# Patient Record
Sex: Female | Born: 1965 | Race: Asian | Hispanic: No | Marital: Married | Smoking: Never smoker
Health system: Southern US, Community
[De-identification: ages and names within clinical notes are randomized; demographics above are authoritative.]

## PROBLEM LIST (undated history)

## (undated) DIAGNOSIS — K219 Gastro-esophageal reflux disease without esophagitis: Secondary | ICD-10-CM

## (undated) DIAGNOSIS — E66811 Obesity, class 1: Secondary | ICD-10-CM

## (undated) DIAGNOSIS — G8929 Other chronic pain: Secondary | ICD-10-CM

## (undated) DIAGNOSIS — L503 Dermatographic urticaria: Secondary | ICD-10-CM

## (undated) DIAGNOSIS — E559 Vitamin D deficiency, unspecified: Secondary | ICD-10-CM

## (undated) DIAGNOSIS — E669 Obesity, unspecified: Secondary | ICD-10-CM

## (undated) DIAGNOSIS — G4730 Sleep apnea, unspecified: Secondary | ICD-10-CM

## (undated) DIAGNOSIS — Z9889 Other specified postprocedural states: Secondary | ICD-10-CM

## (undated) DIAGNOSIS — G8928 Other chronic postprocedural pain: Secondary | ICD-10-CM

## (undated) HISTORY — DX: Obesity, class 1: E66.811

## (undated) HISTORY — DX: Gastro-esophageal reflux disease without esophagitis: K21.9

## (undated) HISTORY — DX: Dermatographic urticaria: L50.3

## (undated) HISTORY — DX: Vitamin D deficiency, unspecified: E55.9

## (undated) HISTORY — DX: Other specified postprocedural states: Z98.890

## (undated) HISTORY — DX: Obesity, unspecified: E66.9

## (undated) HISTORY — DX: Sleep apnea, unspecified: G47.30

## (undated) HISTORY — DX: Other chronic pain: G89.29

## (undated) HISTORY — PX: SPINE SURGERY: SHX786

## (undated) HISTORY — DX: Other chronic postprocedural pain: G89.28

---

## 1997-11-03 ENCOUNTER — Inpatient Hospital Stay (HOSPITAL_COMMUNITY): Admission: AD | Admit: 1997-11-03 | Discharge: 1997-11-03 | Payer: Self-pay | Admitting: Obstetrics & Gynecology

## 1997-11-04 ENCOUNTER — Ambulatory Visit (HOSPITAL_COMMUNITY): Admission: RE | Admit: 1997-11-04 | Discharge: 1997-11-04 | Payer: Self-pay | Admitting: *Deleted

## 1997-11-21 ENCOUNTER — Encounter: Admission: RE | Admit: 1997-11-21 | Discharge: 1997-11-21 | Payer: Self-pay | Admitting: Family Medicine

## 1997-11-22 ENCOUNTER — Encounter: Admission: RE | Admit: 1997-11-22 | Discharge: 1997-11-22 | Payer: Self-pay | Admitting: Family Medicine

## 1997-11-24 ENCOUNTER — Encounter: Admission: RE | Admit: 1997-11-24 | Discharge: 1997-11-24 | Payer: Self-pay | Admitting: Family Medicine

## 1997-11-30 ENCOUNTER — Encounter: Admission: RE | Admit: 1997-11-30 | Discharge: 1997-11-30 | Payer: Self-pay | Admitting: Family Medicine

## 1998-05-24 ENCOUNTER — Encounter: Admission: RE | Admit: 1998-05-24 | Discharge: 1998-05-24 | Payer: Self-pay | Admitting: Family Medicine

## 1998-06-11 ENCOUNTER — Encounter: Admission: RE | Admit: 1998-06-11 | Discharge: 1998-06-11 | Payer: Self-pay | Admitting: Family Medicine

## 1998-07-03 ENCOUNTER — Encounter: Admission: RE | Admit: 1998-07-03 | Discharge: 1998-07-03 | Payer: Self-pay | Admitting: Sports Medicine

## 1998-10-08 ENCOUNTER — Encounter: Admission: RE | Admit: 1998-10-08 | Discharge: 1998-10-08 | Payer: Self-pay | Admitting: Family Medicine

## 1998-10-12 ENCOUNTER — Encounter: Admission: RE | Admit: 1998-10-12 | Discharge: 1998-10-12 | Payer: Self-pay | Admitting: Family Medicine

## 1998-10-16 ENCOUNTER — Ambulatory Visit (HOSPITAL_COMMUNITY): Admission: RE | Admit: 1998-10-16 | Discharge: 1998-10-16 | Payer: Self-pay | Admitting: *Deleted

## 1998-11-07 ENCOUNTER — Encounter: Admission: RE | Admit: 1998-11-07 | Discharge: 1998-11-07 | Payer: Self-pay | Admitting: Family Medicine

## 1998-11-28 ENCOUNTER — Encounter: Admission: RE | Admit: 1998-11-28 | Discharge: 1998-11-28 | Payer: Self-pay | Admitting: Family Medicine

## 1998-12-13 ENCOUNTER — Encounter: Admission: RE | Admit: 1998-12-13 | Discharge: 1998-12-13 | Payer: Self-pay | Admitting: Family Medicine

## 1998-12-27 ENCOUNTER — Encounter: Admission: RE | Admit: 1998-12-27 | Discharge: 1998-12-27 | Payer: Self-pay | Admitting: Family Medicine

## 1999-01-02 ENCOUNTER — Encounter: Admission: RE | Admit: 1999-01-02 | Discharge: 1999-01-02 | Payer: Self-pay | Admitting: Family Medicine

## 1999-01-14 ENCOUNTER — Encounter: Admission: RE | Admit: 1999-01-14 | Discharge: 1999-01-14 | Payer: Self-pay | Admitting: Family Medicine

## 1999-01-20 ENCOUNTER — Inpatient Hospital Stay (HOSPITAL_COMMUNITY): Admission: AD | Admit: 1999-01-20 | Discharge: 1999-01-23 | Payer: Self-pay | Admitting: Obstetrics & Gynecology

## 1999-01-23 ENCOUNTER — Encounter: Admission: RE | Admit: 1999-01-23 | Discharge: 1999-01-23 | Payer: Self-pay | Admitting: Family Medicine

## 1999-03-06 ENCOUNTER — Encounter: Admission: RE | Admit: 1999-03-06 | Discharge: 1999-03-06 | Payer: Self-pay | Admitting: Family Medicine

## 1999-04-22 ENCOUNTER — Encounter: Admission: RE | Admit: 1999-04-22 | Discharge: 1999-04-22 | Payer: Self-pay | Admitting: Family Medicine

## 1999-07-22 ENCOUNTER — Encounter: Admission: RE | Admit: 1999-07-22 | Discharge: 1999-07-22 | Payer: Self-pay | Admitting: Family Medicine

## 1999-09-03 ENCOUNTER — Encounter: Admission: RE | Admit: 1999-09-03 | Discharge: 1999-09-03 | Payer: Self-pay | Admitting: Family Medicine

## 1999-10-14 ENCOUNTER — Encounter: Admission: RE | Admit: 1999-10-14 | Discharge: 1999-10-14 | Payer: Self-pay | Admitting: Family Medicine

## 2000-01-06 ENCOUNTER — Encounter: Admission: RE | Admit: 2000-01-06 | Discharge: 2000-01-06 | Payer: Self-pay | Admitting: Family Medicine

## 2000-04-02 ENCOUNTER — Encounter: Admission: RE | Admit: 2000-04-02 | Discharge: 2000-04-02 | Payer: Self-pay | Admitting: Family Medicine

## 2000-06-26 ENCOUNTER — Encounter: Admission: RE | Admit: 2000-06-26 | Discharge: 2000-06-26 | Payer: Self-pay | Admitting: Family Medicine

## 2000-09-21 ENCOUNTER — Encounter: Admission: RE | Admit: 2000-09-21 | Discharge: 2000-09-21 | Payer: Self-pay | Admitting: Family Medicine

## 2000-12-18 ENCOUNTER — Encounter: Admission: RE | Admit: 2000-12-18 | Discharge: 2000-12-18 | Payer: Self-pay | Admitting: Family Medicine

## 2001-06-11 ENCOUNTER — Encounter: Admission: RE | Admit: 2001-06-11 | Discharge: 2001-06-11 | Payer: Self-pay | Admitting: Family Medicine

## 2001-09-08 ENCOUNTER — Encounter: Admission: RE | Admit: 2001-09-08 | Discharge: 2001-09-08 | Payer: Self-pay | Admitting: Sports Medicine

## 2001-12-03 ENCOUNTER — Encounter: Admission: RE | Admit: 2001-12-03 | Discharge: 2001-12-03 | Payer: Self-pay | Admitting: Family Medicine

## 2002-02-25 ENCOUNTER — Encounter: Admission: RE | Admit: 2002-02-25 | Discharge: 2002-02-25 | Payer: Self-pay | Admitting: Family Medicine

## 2002-05-20 ENCOUNTER — Encounter: Admission: RE | Admit: 2002-05-20 | Discharge: 2002-05-20 | Payer: Self-pay | Admitting: Family Medicine

## 2002-08-12 ENCOUNTER — Encounter: Admission: RE | Admit: 2002-08-12 | Discharge: 2002-08-12 | Payer: Self-pay | Admitting: Family Medicine

## 2002-10-28 ENCOUNTER — Encounter: Admission: RE | Admit: 2002-10-28 | Discharge: 2002-10-28 | Payer: Self-pay | Admitting: Family Medicine

## 2003-01-20 ENCOUNTER — Encounter: Admission: RE | Admit: 2003-01-20 | Discharge: 2003-01-20 | Payer: Self-pay | Admitting: Sports Medicine

## 2003-02-10 ENCOUNTER — Encounter: Admission: RE | Admit: 2003-02-10 | Discharge: 2003-02-10 | Payer: Self-pay | Admitting: Sports Medicine

## 2003-04-21 ENCOUNTER — Encounter: Admission: RE | Admit: 2003-04-21 | Discharge: 2003-04-21 | Payer: Self-pay | Admitting: Family Medicine

## 2003-07-14 ENCOUNTER — Encounter: Admission: RE | Admit: 2003-07-14 | Discharge: 2003-07-14 | Payer: Self-pay | Admitting: Family Medicine

## 2003-10-13 ENCOUNTER — Encounter: Admission: RE | Admit: 2003-10-13 | Discharge: 2003-10-13 | Payer: Self-pay | Admitting: Family Medicine

## 2004-01-12 ENCOUNTER — Ambulatory Visit: Payer: Self-pay | Admitting: Family Medicine

## 2004-04-12 ENCOUNTER — Ambulatory Visit: Payer: Self-pay | Admitting: Family Medicine

## 2004-07-12 ENCOUNTER — Ambulatory Visit: Payer: Self-pay | Admitting: Family Medicine

## 2004-09-17 ENCOUNTER — Ambulatory Visit: Payer: Self-pay | Admitting: Family Medicine

## 2004-10-10 ENCOUNTER — Ambulatory Visit: Payer: Self-pay | Admitting: Family Medicine

## 2004-11-11 ENCOUNTER — Ambulatory Visit: Payer: Self-pay | Admitting: Family Medicine

## 2004-11-18 ENCOUNTER — Encounter: Admission: RE | Admit: 2004-11-18 | Discharge: 2005-02-16 | Payer: Self-pay | Admitting: Family Medicine

## 2005-01-03 ENCOUNTER — Ambulatory Visit: Payer: Self-pay | Admitting: Family Medicine

## 2005-03-28 ENCOUNTER — Ambulatory Visit: Payer: Self-pay | Admitting: Sports Medicine

## 2005-06-27 ENCOUNTER — Ambulatory Visit: Payer: Self-pay | Admitting: Family Medicine

## 2005-09-26 ENCOUNTER — Ambulatory Visit: Payer: Self-pay | Admitting: Family Medicine

## 2005-11-30 ENCOUNTER — Encounter: Admission: RE | Admit: 2005-11-30 | Discharge: 2005-11-30 | Payer: Self-pay | Admitting: Internal Medicine

## 2005-12-19 ENCOUNTER — Ambulatory Visit: Payer: Self-pay | Admitting: Family Medicine

## 2006-01-05 ENCOUNTER — Ambulatory Visit: Payer: Self-pay | Admitting: Family Medicine

## 2006-01-07 ENCOUNTER — Encounter (INDEPENDENT_AMBULATORY_CARE_PROVIDER_SITE_OTHER): Payer: Self-pay | Admitting: *Deleted

## 2006-01-07 LAB — CONVERTED CEMR LAB

## 2006-03-20 ENCOUNTER — Ambulatory Visit: Payer: Self-pay | Admitting: Family Medicine

## 2006-04-30 DIAGNOSIS — M2540 Effusion, unspecified joint: Secondary | ICD-10-CM | POA: Insufficient documentation

## 2006-05-01 ENCOUNTER — Encounter (INDEPENDENT_AMBULATORY_CARE_PROVIDER_SITE_OTHER): Payer: Self-pay | Admitting: *Deleted

## 2006-06-18 ENCOUNTER — Ambulatory Visit: Payer: Self-pay | Admitting: Family Medicine

## 2006-09-15 ENCOUNTER — Ambulatory Visit: Payer: Self-pay | Admitting: Family Medicine

## 2006-12-04 ENCOUNTER — Encounter: Payer: Self-pay | Admitting: Family Medicine

## 2006-12-04 ENCOUNTER — Ambulatory Visit: Payer: Self-pay | Admitting: Family Medicine

## 2007-01-19 ENCOUNTER — Encounter: Admission: RE | Admit: 2007-01-19 | Discharge: 2007-01-19 | Payer: Self-pay | Admitting: Internal Medicine

## 2007-04-07 ENCOUNTER — Ambulatory Visit (HOSPITAL_COMMUNITY): Admission: RE | Admit: 2007-04-07 | Discharge: 2007-04-07 | Payer: Self-pay | Admitting: Neurosurgery

## 2007-04-29 ENCOUNTER — Ambulatory Visit: Payer: Self-pay | Admitting: Family Medicine

## 2007-05-18 ENCOUNTER — Encounter: Admission: RE | Admit: 2007-05-18 | Discharge: 2007-05-18 | Payer: Self-pay | Admitting: Neurosurgery

## 2007-06-29 ENCOUNTER — Encounter: Admission: RE | Admit: 2007-06-29 | Discharge: 2007-06-29 | Payer: Self-pay | Admitting: Neurosurgery

## 2007-07-19 ENCOUNTER — Telehealth: Payer: Self-pay | Admitting: Family Medicine

## 2007-08-11 ENCOUNTER — Ambulatory Visit: Payer: Self-pay | Admitting: Family Medicine

## 2007-09-14 ENCOUNTER — Encounter: Payer: Self-pay | Admitting: Family Medicine

## 2008-01-05 ENCOUNTER — Encounter: Payer: Self-pay | Admitting: Family Medicine

## 2008-02-16 ENCOUNTER — Encounter: Payer: Self-pay | Admitting: *Deleted

## 2009-10-08 IMAGING — CR DG CERVICAL SPINE 1V
1 series · 1 of 1 positions shown · non-contrast
Comparison: 05/18/2007

CLINICAL DATA: Cervical spondylosis.  Recent cervical spine fusion.

CERVICAL SPINE - 1 VIEW

[view not recorded]
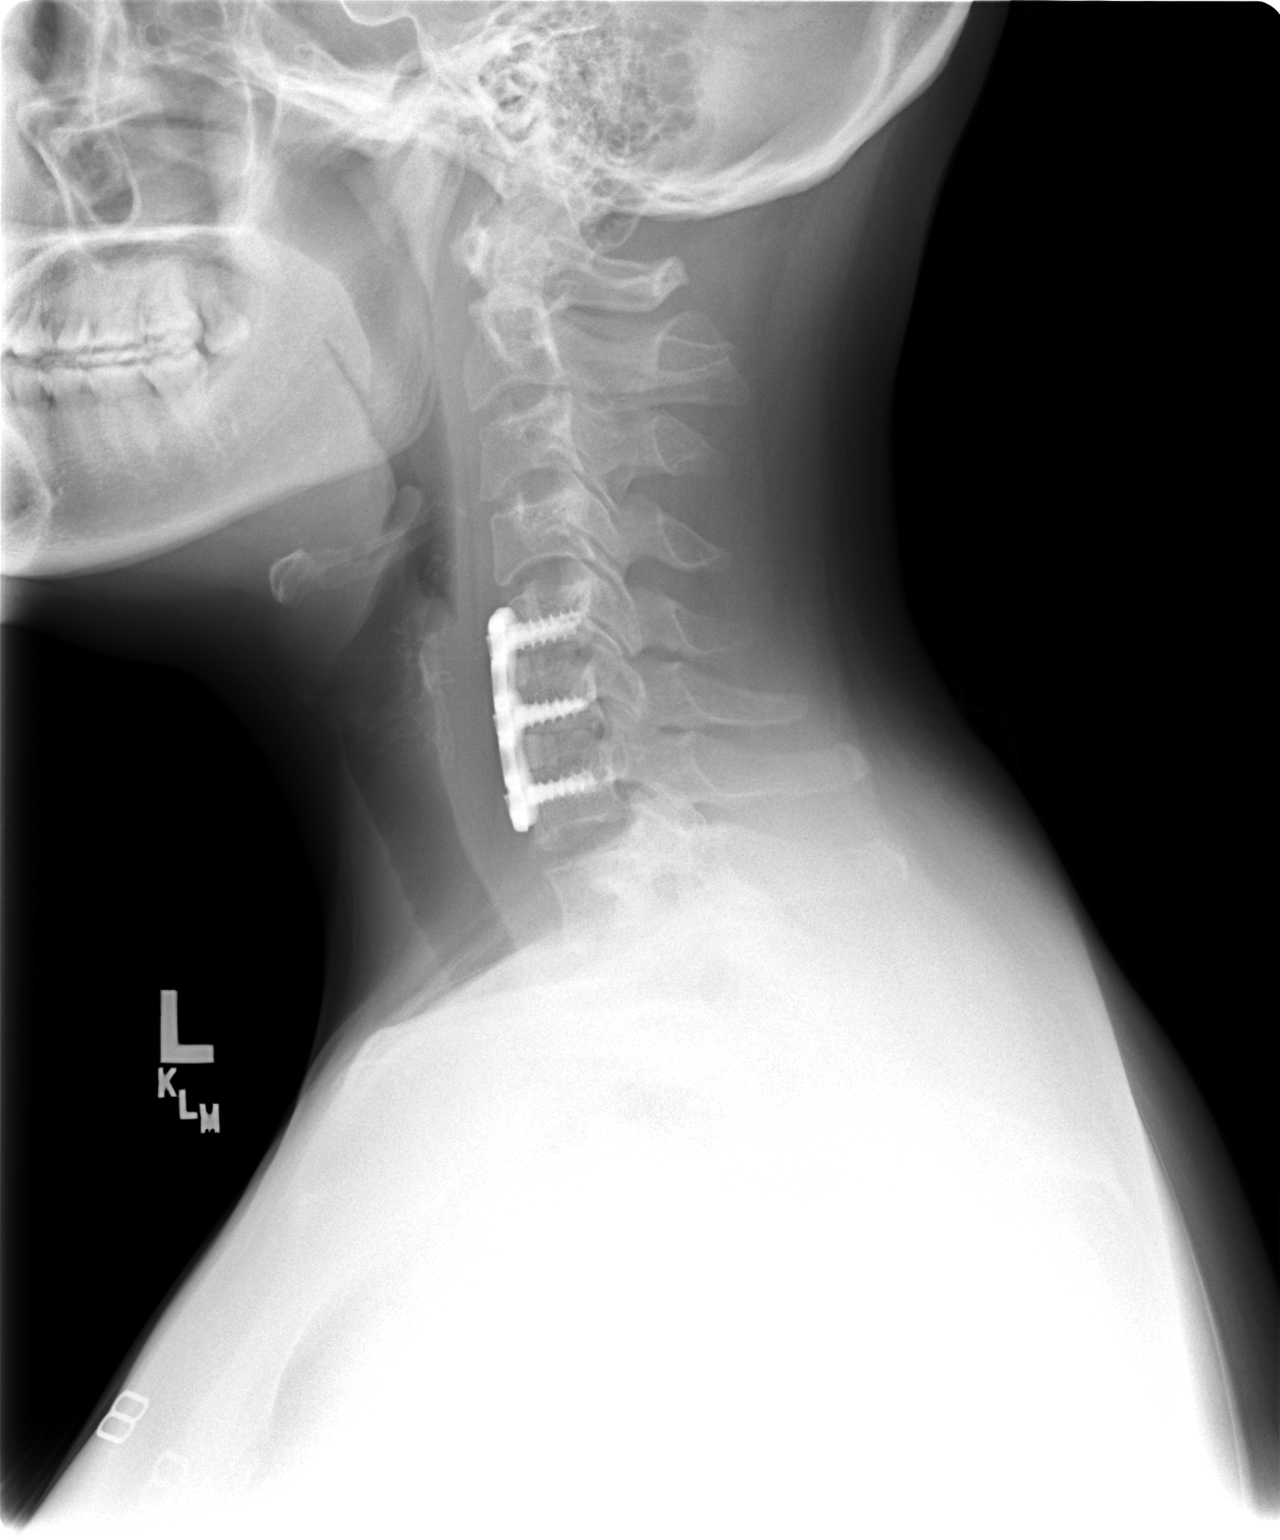

[1 of 1 positions shown; findings below may reference images not displayed]

FINDINGS: Anterior fixation plate and screws and interbody bone
plugs remain in appropriate position at C5-6 and C6-7.  Alignment
is normal.

The other intervertebral disc spaces are maintained.  No other
significant bone abnormality identified.  There is no evidence of
prevertebral soft tissue swelling.
IMPRESSION: Expected postoperative appearance of the anterior cervical spine
fusion from C5 to C7.  Normal alignment.

## 2010-07-16 NOTE — Op Note (Signed)
NAME:  Erika Perez, Erika Perez'                   ACCOUNT NO.:  [id number]   MEDICAL RECORD NO.:  [id number]          PATIENT TYPE:  OIB   LOCATION:  3599                         FACILITY:  [facility]   PHYSICIAN:  Donalee Citrin, M.D.        DATE OF BIRTH:  16-Jun-1965   DATE OF PROCEDURE:  04/07/2007  DATE OF DISCHARGE:                               OPERATIVE REPORT   PREOPERATIVE DIAGNOSIS:  Cervical spondylosis with C6 and C7  radiculopathy, at C5-6 and C6-7.   PROCEDURE:  Anterior cervical diskectomy and fusion, C5-6 and C6-7,  using a 6-mm allograft at C5-6 and a 7-mm allograft at C6-7, and a 37.5  mm Venture plate with six 04-VW variable-angled screws.   SURGEON:  Donalee Citrin, MD.   ASSISTANT:  Tia Alert, MD.   ANESTHESIA:  General endotracheal anesthesia.   HISTORY OF PRESENT ILLNESS:  Ms. Duffy is a very pleasant 45 year old  white female who has a longstanding neck and bilateral arm pain, worse  on the left, radiating down to her forearm and thumb and forefinger.  It  has been refractory to all forms of conservative treatment over the last  several years.  The patient noticed weakness in her hand and weakness in  her triceps preoperatively.  MRI scan showed spondylosis with  spondylitic compression of the spinal cord and both C6 and C7 nerve  roots.  The patient now after failure of conservative treatment was  recommended anterior cervical diskectomy and fusion.  The risks and  benefits of the operation were discussed with the patient and she  understands and agreed to proceed forward.   The patient was brought into the OR, was induced under general  anesthesia, positioned supine, and after the neck was placed in 5 pounds  of halter traction, the right side of the neck was prepped and draped in  the usual sterile fashion.  Preoperative imaging localized the  appropriate level.  A curvilinear incision was made just off the midline  to the anterior border of the sternocleidomastoid muscle.   The platysma  was dissected down and divided longitudinally.  The avascular plane  between the sternocleidomastoid and strap muscles was developed down to  the prevertebral fascia.  The prevertebral fascia was then dissected  away with Kitners.  Intraoperative x-ray confirmed localization of the  appropriate level.  Annulotomy was made with an 11 blade and a 15-blade  scalpel and then the disk space was cleaned out.  The longus colli was  then reflected laterally and the self-retaining retractor was placed.  Both interspaces were then cleaned out.  A large anterior osteophyte was  bitten off with the 2 and 3-mm Kerrison punch.  High-speed drill was  used to drill down both interspaces to the posterior annulus and  posterior osteophytic complexes.  Then at this point the operating  microscope was draped and brought into the field.  Under microscopic  illumination the C6-7 disk space with a 1-mm Kerrison punch used to  remove the posterior annulus and posterior longitudinal ligament.  Large  posterior osteophytes were  coming off the C6 vertebral body.  These were  all aggressively underbitten, decompressing the central canal, marching  across the endplates to decompress the right C7 neural foramen.  After  this was attained, attention was taken to mark off the left side.  The  left C7 nerve root was skeletonized flush with the pedicle and  completely decompressed, explored with an angled nerve hook and noted to  be widely patent.  Gelfoam was placed in the disk space.  Attention was  taken to C5-6.  At C5-6 in a similar fashion the disk space was drilled  down and cleaned out.  Then with the 2-mm Kerrison punch large anterior  osteophytes were bitten off the C5 and C6 vertebral bodies,  decompressing the central canal and both C6 nerve roots.  The C6 nerve  root on the left was especially skeletonized flush with the pedicle, as  well as the right, and then the endplates were scraped.  The  wound was  copiously irrigated and meticulous hemostasis was maintained.  A 6-mm  allograft was inserted at C5-6 and a 7-mm at C6-7 after preparation of  the endplates.  Then a 37.5-mm Venture plate was placed.  All screws had  excellent purchase.  Locking mechanism engaged.  The wound was then  copiously irrigated and meticulous hemostasis was maintained.  The wound  was closed in layers with interrupted  Vicryl in the platysma and a  running 4-0 subcuticular.  Benzoin and Steri-Strips were applied.  The  patient went to the recovery room in stable condition.  At the end of  the case the instrument, needle and sponge count was correct.           ______________________________  Donalee Citrin, M.D.     GC/MEDQ  D:  04/07/2007  T:  04/08/2007  Job:  [id number]

## 2010-11-22 LAB — CBC
HCT: 38.2
Hemoglobin: 12.9
MCV: 87.3
Platelets: 235
RBC: 4.38
WBC: 7.5

## 2010-11-22 LAB — BASIC METABOLIC PANEL
BUN: 4 — ABNORMAL LOW
Chloride: 108
Potassium: 4.7

## 2011-10-30 ENCOUNTER — Ambulatory Visit (INDEPENDENT_AMBULATORY_CARE_PROVIDER_SITE_OTHER): Payer: 59 | Admitting: Physician Assistant

## 2011-10-30 VITALS — BP 122/70 | HR 72 | Temp 98.0°F | Resp 14 | Ht 60.5 in | Wt 138.6 lb

## 2011-10-30 DIAGNOSIS — R509 Fever, unspecified: Secondary | ICD-10-CM

## 2011-10-30 DIAGNOSIS — J029 Acute pharyngitis, unspecified: Secondary | ICD-10-CM

## 2011-10-30 LAB — POCT CBC
Granulocyte percent: 66.8 %G (ref 37–80)
MID (cbc): 0.5 (ref 0–0.9)
MPV: 10.9 fL (ref 0–99.8)
POC Granulocyte: 5.2 (ref 2–6.9)
POC LYMPH PERCENT: 26.7 %L (ref 10–50)
POC MID %: 6.5 %M (ref 0–12)
Platelet Count, POC: 273 10*3/uL (ref 142–424)
RDW, POC: 13.1 %

## 2011-10-30 MED ORDER — MAGIC MOUTHWASH W/LIDOCAINE: 5.0000 mL | Freq: Four times a day (QID) | ORAL | Status: DC | PRN

## 2011-10-30 MED ORDER — FLUTICASONE PROPIONATE 50 MCG/ACT NA SUSP: 2.0000 | Freq: Every day | NASAL | Status: DC

## 2011-10-30 NOTE — Progress Notes (Signed)
  Subjective:    Patient ID: Erika Perez, female    DOB: 11-09-1965, 46 y.o.   MRN: [id number]  HPI 46 yr old [place] female presents with a sore throat X 1 day.  She hasn't felt great for the past week. She has also had nasal congestion.  She denies cough.  Unsure of fever-she felt hot yesterday.  No night sweats. No N/V/D.   Review of Systems  All other systems reviewed and are negative.      Objective:   Physical Exam  Nursing note and vitals reviewed. Constitutional: She is oriented to person, place, and time. She appears well-developed and well-nourished.  HENT:  Head: Normocephalic and atraumatic.  Right Ear: External ear normal.  Left Ear: External ear normal.  Mouth/Throat: No oropharyngeal exudate (erythema of throat ).  Neck: Normal range of motion. Neck supple. No thyromegaly present.  Cardiovascular: Normal rate, regular rhythm and normal heart sounds.   Pulmonary/Chest: Effort normal and breath sounds normal.  Lymphadenopathy:    She has no cervical adenopathy.  Neurological: She is alert and oriented to person, place, and time.  Skin: Skin is warm and dry.  Psychiatric: She has a normal mood and affect. Her behavior is normal.    Results for orders placed in visit on 10/30/11  POCT RAPID STREP A (OFFICE)      Component Value Range   Rapid Strep A Screen Negative  Negative  POCT CBC      Component Value Range   WBC 7.8  4.6 - 10.2 K/uL   Lymph, poc 2.1  0.6 - 3.4   POC LYMPH PERCENT 26.7  10 - 50 %L   MID (cbc) 0.5  0 - 0.9   POC MID % 6.5  0 - 12 %M   POC Granulocyte 5.2  2 - 6.9   Granulocyte percent 66.8  37 - 80 %G   RBC 4.73  4.04 - 5.48 M/uL   Hemoglobin 13.2  12.2 - 16.2 g/dL   HCT, POC 40.9  81.1 - 47.9 %   MCV 92.4  80 - 97 fL   MCH, POC 27.9  27 - 31.2 pg   MCHC 30.2 (*) 31.8 - 35.4 g/dL   RDW, POC 91.4     Platelet Count, POC 273  142 - 424 K/uL   MPV 10.9  0 - 99.8 fL       Assessment & Plan:  Non-strep pharyngitis/URI-MMW  and flonase. Fluids, rest.  Recheck in 48 hrs if not improving-sooner if worse.

## 2013-06-13 ENCOUNTER — Ambulatory Visit (INDEPENDENT_AMBULATORY_CARE_PROVIDER_SITE_OTHER): Payer: 59 | Admitting: Family Medicine

## 2013-06-13 VITALS — BP 108/66 | HR 72 | Temp 98.1°F | Resp 16 | Ht 60.5 in | Wt 141.0 lb

## 2013-06-13 DIAGNOSIS — Z0000 Encounter for general adult medical examination without abnormal findings: Secondary | ICD-10-CM

## 2013-06-13 LAB — GLUCOSE, POCT (MANUAL RESULT ENTRY): POC Glucose: 105 mg/dl — AB (ref 70–99)

## 2013-06-13 NOTE — Patient Instructions (Signed)

## 2013-06-13 NOTE — Progress Notes (Signed)
° °  Subjective:    Patient ID: Erika Perez, female    DOB: 1965-06-30, 48 y.o.   MRN: [id number] This chart was scribed for Elvina SidleKurt Lauenstein, MD by Marica OtterNusrat Rahman, ED Scribe at [facility]. This patient was seen in room Room 11 and the patient's care was started at 10:55 AM.  HPI  HPI Comments: Erika Perez is a 48 y.o. female, with a history of effusion/swelling of joint (unspecified), who presents to the [facility] for her annual exam. Pt denies any problems with her abd and reports she has not had any appetite changes.   Pt is from [place] and currently works in a Development worker, [employer]. Pt has 2 children a 46 yo and 46 yo.    Review of Systems  Constitutional: Negative for appetite change.  Gastrointestinal: Negative for abdominal pain.  All other systems reviewed and are negative.  Objective:  Physical Exam  Nursing note and vitals reviewed.  patient in no acute distress, she seen with her husband and she's cooperative and friendly. HEENT: Unremarkable Chest: Clear Heart: Regular no murmur Abdomen: Soft nontender without HSM Extremities: No edema   Assessment & Plan:  DIAGNOSTIC STUDIES: Oxygen Saturation is 99% on RA, normal by my interpretation.    COORDINATION OF CARE: 11:01 AM-Discussed treatment plan which includes labs with pt at bedside and pt agreed to plan. Further spoke with pt regarding contacting them with lab results. Pt is also advised to get a mammogram.   Annual physical exam - Plan: POCT glucose (manual entry), Lipid panel  Signed, Elvina SidleKurt Lauenstein, MD

## 2013-06-14 LAB — LIPID PANEL
Cholesterol: 141 mg/dL (ref 0–200)
HDL: 51 mg/dL (ref >39)
LDL Cholesterol: 80 mg/dL (ref 0–99)
Total CHOL/HDL Ratio: 2.8 Ratio
Triglycerides: 52 mg/dL (ref <150)
VLDL: 10 mg/dL (ref 0–40)

## 2014-05-23 ENCOUNTER — Ambulatory Visit (INDEPENDENT_AMBULATORY_CARE_PROVIDER_SITE_OTHER): Payer: 59 | Admitting: Family Medicine

## 2014-05-23 VITALS — BP 108/64 | HR 77 | Temp 98.3°F | Resp 16 | Ht 60.5 in | Wt 146.0 lb

## 2014-05-23 DIAGNOSIS — J0101 Acute recurrent maxillary sinusitis: Secondary | ICD-10-CM

## 2014-05-23 MED ORDER — AMOXICILLIN 875 MG PO TABS: 875.0000 mg | ORAL_TABLET | Freq: Two times a day (BID) | ORAL | Status: DC

## 2014-05-23 MED ORDER — HYDROCODONE-HOMATROPINE 5-1.5 MG/5ML PO SYRP
5.0000 mL | ORAL_SOLUTION | Freq: Three times a day (TID) | ORAL | Status: DC | PRN
Start: 2014-05-23 — End: 2019-03-01

## 2014-05-23 NOTE — Patient Instructions (Signed)

## 2014-05-23 NOTE — Progress Notes (Signed)
° °  Subjective:  This chart was scribed for Erika SidleKurt Lauenstein, MD by Richarda Overlieichard Holland, Medical scribe. This patient was seen in ROOM 9 and the patient's care was started 5:28 PM.   Patient ID: Erika Perez, female    DOB: [birth year], 49 y.o.   MRN: [id number] Chief Complaint  Patient presents with   Illness    congestion, feverm aches, cough x 1 week    HPI  HPI Comments: H PRIEM B Marlan PalauBuonya is a 49 y.o. female who [facility] complaining of congestion for the last week. Pt reports associated fever, body aches, sore throat, HA, sinus pressure, rhinorrhea and cough. She states that she has felt slightly nauseated today but says she did not vomit. Pt reports she has had trouble sleeping due to her symptoms, especially her cough. Her husband reports NKDA. Pt reports no pertinent past medical history at this time. She denies any trouble breathing.    Patient Active Problem List   Diagnosis Date Noted   EFFUSION/SWELLING OF JOINT, UNSPECIFIED 04/30/2006   No past medical history on file. No Known Allergies No current outpatient prescriptions on file prior to visit.   No current facility-administered medications on file prior to visit.   No family history on file. History   Social History   Marital Status: Married    Spouse Name: N/A   Number of Children: N/A   Years of Education: N/A   Occupational History   Not on file.   Social History Main Topics   Smoking status: Never Smoker    Smokeless tobacco: Not on file   Alcohol Use: No   Drug Use: No   Sexual Activity: Not on file   Other Topics Concern   Not on file   Social History Narrative    Review of Systems  HENT: Positive for congestion, rhinorrhea, sinus pressure and sore throat.   Respiratory: Positive for cough.   Gastrointestinal: Positive for nausea. Negative for abdominal pain.  Neurological: Positive for headaches.       Objective:   Physical Exam  Constitutional: She is oriented to  person, place, and time. She appears well-developed and well-nourished.  HENT:  Head: Normocephalic and atraumatic.  Mcosa purulent discharge both nasal passages with marked erytehrma. Tenderness on maxillary bones.  Eyes: Right eye exhibits no discharge. Left eye exhibits no discharge.  Neck: Normal range of motion. Neck supple. No tracheal deviation present.  Cardiovascular: Normal rate.   Pulmonary/Chest: Effort normal. No respiratory distress. She exhibits no tenderness.  Abdominal: She exhibits no distension.  Neurological: She is alert and oriented to person, place, and time.  Skin: Skin is warm and dry.  Psychiatric: She has a normal mood and affect.  Nursing note and vitals reviewed.  Filed Vitals:   05/23/14 1717  BP: 108/64  Pulse: 77  Temp: 98.3 F (36.8 C)  Resp: 16  Height: 5' 0.5" (1.537 m)  Weight: 146 lb (66.225 kg)  SpO2: 100%       Assessment & Plan:   This chart was scribed in my presence and reviewed by me personally.    ICD-9-CM ICD-10-CM   1. Recurrent maxillary sinusitis, unspecified chronicity 461.0 J01.01 amoxicillin (AMOXIL) 875 MG tablet     HYDROcodone-homatropine (HYCODAN) 5-1.5 MG/5ML syrup     Signed, Erika SidleKurt Lauenstein, MD

## 2014-11-13 ENCOUNTER — Ambulatory Visit (INDEPENDENT_AMBULATORY_CARE_PROVIDER_SITE_OTHER): Payer: 59 | Admitting: Family Medicine

## 2014-11-13 VITALS — BP 110/62 | HR 71 | Temp 98.0°F | Resp 16 | Ht 60.5 in | Wt 141.8 lb

## 2014-11-13 DIAGNOSIS — M722 Plantar fascial fibromatosis: Secondary | ICD-10-CM | POA: Diagnosis not present

## 2014-11-13 NOTE — Patient Instructions (Signed)
It seems that you have plantar fascitis This is a common condition that causes pain in the bottom of your feet Stretch your feet before you get up in the morning Wear supportive shoes and "heel cups" which you can buy at the store You can also try ibuprofen or aleve as needed  Let me know if you are not improved in 1-2 weeks

## 2014-11-13 NOTE — Progress Notes (Signed)
[facility] [address] [place] [address] [phone]  Date:  11/13/2014   Name:  Erika Perez   DOB:  11-01-1965   MRN:  [id number]  PCP:  No primary care provider on file.    Chief Complaint: Foot Pain   History of Present Illness:  [name] is a 49 y.o. very pleasant female patient who presents with the following:  Here today to evaluate foot pain- both feet hurt but the left is worse.   She has noted this for about 2 weeks.   No known injury, no change in activity The pain is worst in the early am She is OW generally in good health   She is post- menopausal  Patient Active Problem List   Diagnosis Date Noted  . EFFUSION/SWELLING OF JOINT, UNSPECIFIED 04/30/2006    History reviewed. No pertinent past medical history.  Past Surgical History  Procedure Laterality Date  . Cesarean section    . Spine surgery      Social History  Substance Use Topics  . Smoking status: Never Smoker   . Smokeless tobacco: None  . Alcohol Use: No    History reviewed. No pertinent family history.  No Known Allergies  Medication list has been reviewed and updated.  Current Outpatient Prescriptions on File Prior to Visit  Medication Sig Dispense Refill  . amoxicillin (AMOXIL) 875 MG tablet Take 1 tablet (875 mg total) by mouth 2 (two) times daily. (Patient not taking: Reported on 11/13/2014) 20 tablet 0  . HYDROcodone-homatropine (HYCODAN) 5-1.5 MG/5ML syrup Take 5 mLs by mouth every 8 (eight) hours as needed for cough. (Patient not taking: Reported on 11/13/2014) 120 mL 0   No current facility-administered medications on file prior to visit.    Review of Systems:  As per HPI- otherwise negative.   Physical Examination: Filed Vitals:   11/13/14 1003  BP: 110/62  Pulse: 71  Temp: 98 F (36.7 C)  Resp: 16   Filed Vitals:   11/13/14 1003  Height: 5' 0.5" (1.537 m)  Weight: 141 lb 12.8 oz (64.32 kg)   Body mass index is 27.23  kg/(m^2). Ideal Body Weight: Weight in (lb) to have BMI = 25: 129.9  GEN: WDWN, NAD, Non-toxic, A & O x 3 HEENT: Atraumatic, Normocephalic. Neck supple. No masses, No LAD. Ears and Nose: No external deformity. CV: RRR, No M/G/R. No JVD. No thrill. No extra heart sounds. PULM: CTA B, no wheezes, crackles, rhonchi. No retractions. No resp. distress. No accessory muscle use. EXTR: No c/c/e NEURO Normal gait.  PSYCH: Normally interactive. Conversant. Not depressed or anxious appearing.  Calm demeanor.  Left foot: she is mildly tender under the left heel-consistent with plantar fascitis.  Otherwise feet, ankles are negative, normal BDP pulses, normal capillary refill Wearing flip-flops  Assessment and Plan: Plantar fasciitis of left foot  Discussed stretches, NSAIDs, can roll, wearing more supporitve shoes and using heel cups She will work on these things and let me know if not improving  Signed Abbe Amsterdam, MD

## 2016-11-17 ENCOUNTER — Encounter: Payer: Self-pay | Admitting: Physician Assistant

## 2016-11-17 ENCOUNTER — Ambulatory Visit (INDEPENDENT_AMBULATORY_CARE_PROVIDER_SITE_OTHER): Payer: 59 | Admitting: Physician Assistant

## 2016-11-17 VITALS — BP 122/78 | HR 80 | Temp 98.2°F | Ht 65.0 in | Wt 155.0 lb

## 2016-11-17 DIAGNOSIS — R109 Unspecified abdominal pain: Secondary | ICD-10-CM

## 2016-11-17 DIAGNOSIS — R300 Dysuria: Secondary | ICD-10-CM

## 2016-11-17 DIAGNOSIS — R5381 Other malaise: Secondary | ICD-10-CM

## 2016-11-17 LAB — CBC
HEMATOCRIT: 35.5 % (ref 34.0–46.6)
HEMOGLOBIN: 11.5 g/dL (ref 11.1–15.9)
MCH: 28 pg (ref 26.6–33.0)
MCHC: 32.4 g/dL (ref 31.5–35.7)
MCV: 87 fL (ref 79–97)
Platelets: 290 10*3/uL (ref 150–379)
RBC: 4.1 x10E6/uL (ref 3.77–5.28)
RDW: 12.8 % (ref 12.3–15.4)
WBC: 7.3 10*3/uL (ref 3.4–10.8)

## 2016-11-17 LAB — POCT URINALYSIS DIP (MANUAL ENTRY)
GLUCOSE UA: NEGATIVE mg/dL
Ketones, POC UA: NEGATIVE mg/dL
Nitrite, UA: NEGATIVE
Spec Grav, UA: 1.01 (ref 1.010–1.025)
UROBILINOGEN UA: 0.2 U/dL
pH, UA: 6 (ref 5.0–8.0)

## 2016-11-17 MED ORDER — SULFAMETHOXAZOLE-TRIMETHOPRIM 800-160 MG PO TABS: 1.0000 | ORAL_TABLET | Freq: Two times a day (BID) | ORAL | 0 refills | Status: AC

## 2016-11-17 NOTE — Progress Notes (Signed)
PRIMARY CARE AT [place], [place] [address] [phone]  Date:  11/17/2016   Name:  Erika Perez   DOB:  [birth year]   MRN:  [id number]  PCP:  Joretta Bachelor, PA    History of Present Illness:  Erika Perez is a 51 y.o. female patient who presents to PCP with  Chief Complaint  Patient presents with  . Chills    x 3 days  . Generalized Body Aches    x 3 days  . Back Pain    x 3 days     3 days of malaise, general body aches and body pain.  She states that she initially had some dysuria, but this has resolved.  She has no cough, sore trhoat, or congestion.  She has no hx of tics.  No outdoor activity.  No recent swimming.  Mild nausea no emesis.    Patient Active Problem List   Diagnosis Date Noted  . EFFUSION/SWELLING OF JOINT, UNSPECIFIED 04/30/2006    No past medical history on file.  Past Surgical History:  Procedure Laterality Date  . CESAREAN SECTION    . SPINE SURGERY      Social History  Substance Use Topics  . Smoking status: Never Smoker  . Smokeless tobacco: Never Used  . Alcohol use No    No family history on file.  No Known Allergies  Medication list has been reviewed and updated.  Current Outpatient Prescriptions on File Prior to Visit  Medication Sig Dispense Refill  . amoxicillin (AMOXIL) 875 MG tablet Take 1 tablet (875 mg total) by mouth 2 (two) times daily. (Patient not taking: Reported on 11/13/2014) 20 tablet 0  . HYDROcodone-homatropine (HYCODAN) 5-1.5 MG/5ML syrup Take 5 mLs by mouth every 8 (eight) hours as needed for cough. (Patient not taking: Reported on 11/13/2014) 120 mL 0  . naproxen sodium (ANAPROX) 220 MG tablet Take 220 mg by mouth 2 (two) times daily with a meal.     No current facility-administered medications on file prior to visit.     ROS ROS otherwise unremarkable unless listed above.  Physical Examination: BP 122/78   Pulse 80   Temp 98.2 F (36.8 C) (Oral)   Ht '5\' 5"'  (1.651 m)   Wt  155 lb (70.3 kg)   BMI 25.79 kg/m  Ideal Body Weight: Weight in (lb) to have BMI = 25: 149.9  Physical Exam  Constitutional: She is oriented to person, place, and time. She appears well-developed and well-nourished. No distress.  HENT:  Head: Normocephalic and atraumatic.  Right Ear: External ear normal.  Left Ear: External ear normal.  Eyes: Pupils are equal, round, and reactive to light. Conjunctivae and EOM are normal.  Cardiovascular: Normal rate, regular rhythm and normal heart sounds.  Exam reveals no gallop.   No murmur heard. Pulmonary/Chest: Effort normal. No apnea. No respiratory distress. She has no decreased breath sounds. She has no wheezes. She has no rhonchi.  Abdominal: Soft. Normal appearance and bowel sounds are normal. There is generalized tenderness and tenderness in the suprapubic area.  Neurological: She is alert and oriented to person, place, and time.  Skin: She is not diaphoretic.  Psychiatric: She has a normal mood and affect. Her behavior is normal.    Results for orders placed or performed in visit on 11/17/16  POCT urinalysis dipstick  Result Value Ref Range   Color, UA yellow yellow   Clarity, UA cloudy (A) clear  Glucose, UA negative negative mg/dL   Bilirubin, UA small (A) negative   Ketones, POC UA negative negative mg/dL   Spec Grav, UA 1.010 1.010 - 1.025   Blood, UA small (A) negative   pH, UA 6.0 5.0 - 8.0   Protein Ur, POC trace (A) negative mg/dL   Urobilinogen, UA 0.2 0.2 or 1.0 E.U./dL   Nitrite, UA Negative Negative   Leukocytes, UA Trace (A) Negative     Assessment and Plan: Erika Perez is a 51 y.o. female who is here today for cc of  Chief Complaint  Patient presents with  . Chills    x 3 days  . Generalized Body Aches    x 3 days  . Back Pain    x 3 days  will treat for possible uti.  Follow up with labs pending, and urine culture.   This could be a virus.  Abdominal pain, unspecified abdominal location - Plan:  POCT urinalysis dipstick, CBC, sulfamethoxazole-trimethoprim (BACTRIM DS,SEPTRA DS) 800-160 MG tablet, Urine Culture, CMP14+EGFR  Malaise - Plan: POCT urinalysis dipstick, CBC, sulfamethoxazole-trimethoprim (BACTRIM DS,SEPTRA DS) 800-160 MG tablet, Urine Culture, CMP14+EGFR  Dysuria - Plan: Urine Culture, CMP14+EGFR  Ivar Drape, PA-C [facility] 9/20/20188:07 AM

## 2016-11-17 NOTE — Patient Instructions (Addendum)
I am treating you for possible urinary tract infection.  I will contact you about the results. Take ibuprofen or tylenol for your pain.   Urinary Tract Infection, Adult A urinary tract infection (UTI) is an infection of any part of the urinary tract. The urinary tract includes the:  Kidneys.  Ureters.  Bladder.  Urethra.  These organs make, store, and get rid of pee (urine) in the body. Follow these instructions at home:  Take over-the-counter and prescription medicines only as told by your doctor.  If you were prescribed an antibiotic medicine, take it as told by your doctor. Do not stop taking the antibiotic even if you start to feel better.  Avoid the following drinks: ? Alcohol. ? Caffeine. ? Tea. ? Carbonated drinks.  Drink enough fluid to keep your pee clear or pale yellow.  Keep all follow-up visits as told by your doctor. This is important.  Make sure to: ? Empty your bladder often and completely. Do not to hold pee for long periods of time. ? Empty your bladder before and after sex. ? Wipe from front to back after a bowel movement if you are female. Use each tissue one time when you wipe. Contact a doctor if:  You have back pain.  You have a fever.  You feel sick to your stomach (nauseous).  You throw up (vomit).  Your symptoms do not get better after 3 days.  Your symptoms go away and then come back. Get help right away if:  You have very bad back pain.  You have very bad lower belly (abdominal) pain.  You are throwing up and cannot keep down any medicines or water. This information is not intended to replace advice given to you by your health care provider. Make sure you discuss any questions you have with your health care provider. Document Released: 08/06/2007 Document Revised: 07/26/2015 Document Reviewed: 01/08/2015 Elsevier Interactive Patient Education  2018 [employer].     IF you received an x-ray today, you will receive an invoice  from [facility]. Please contact [facility] at [phone] with questions or concerns regarding your invoice.   IF you received labwork today, you will receive an invoice from [facility]. Please contact [facility] at [phone] with questions or concerns regarding your invoice.   Our billing staff will not be able to assist you with questions regarding bills from these companies.  You will be contacted with the lab results as soon as they are available. The fastest way to get your results is to activate your My Chart account. Instructions are located on the last page of this paperwork. If you have not heard from [place] regarding the results in 2 weeks, please contact this office.

## 2016-11-18 LAB — URINE CULTURE

## 2016-11-18 LAB — CMP14+EGFR
A/G RATIO: 1.3 (ref 1.2–2.2)
ALBUMIN: 4 g/dL (ref 3.5–5.5)
ALK PHOS: 87 IU/L (ref 39–117)
ALT: 8 IU/L (ref 0–32)
AST: 17 IU/L (ref 0–40)
BILIRUBIN TOTAL: 0.3 mg/dL (ref 0.0–1.2)
BUN / CREAT RATIO: 10 (ref 9–23)
BUN: 8 mg/dL (ref 6–24)
CHLORIDE: 104 mmol/L (ref 96–106)
CO2: 24 mmol/L (ref 20–29)
CREATININE: 0.77 mg/dL (ref 0.57–1.00)
Calcium: 9 mg/dL (ref 8.7–10.2)
GFR calc Af Amer: 105 mL/min/{1.73_m2} (ref >59)
GFR calc non Af Amer: 91 mL/min/{1.73_m2} (ref >59)
GLOBULIN, TOTAL: 3.1 g/dL (ref 1.5–4.5)
Glucose: 102 mg/dL — ABNORMAL HIGH (ref 65–99)
Potassium: 3.4 mmol/L — ABNORMAL LOW (ref 3.5–5.2)
SODIUM: 145 mmol/L — AB (ref 134–144)
Total Protein: 7.1 g/dL (ref 6.0–8.5)

## 2016-12-25 DIAGNOSIS — H40033 Anatomical narrow angle, bilateral: Secondary | ICD-10-CM | POA: Diagnosis not present

## 2016-12-25 DIAGNOSIS — H04123 Dry eye syndrome of bilateral lacrimal glands: Secondary | ICD-10-CM | POA: Diagnosis not present

## 2016-12-25 DIAGNOSIS — H40023 Open angle with borderline findings, high risk, bilateral: Secondary | ICD-10-CM | POA: Diagnosis not present

## 2017-06-03 ENCOUNTER — Encounter: Payer: Self-pay | Admitting: Physician Assistant

## 2017-07-18 ENCOUNTER — Ambulatory Visit: Payer: 59 | Admitting: Family Medicine

## 2017-07-21 ENCOUNTER — Ambulatory Visit: Payer: 59 | Admitting: Family Medicine

## 2017-11-30 ENCOUNTER — Ambulatory Visit: Payer: 59 | Admitting: Family Medicine

## 2017-11-30 VITALS — BP 127/79 | HR 76 | Temp 98.9°F | Resp 16 | Ht 65.0 in | Wt 163.0 lb

## 2017-11-30 DIAGNOSIS — L503 Dermatographic urticaria: Secondary | ICD-10-CM

## 2017-11-30 DIAGNOSIS — M545 Low back pain, unspecified: Secondary | ICD-10-CM

## 2017-11-30 DIAGNOSIS — R200 Anesthesia of skin: Secondary | ICD-10-CM | POA: Diagnosis not present

## 2017-11-30 DIAGNOSIS — Z981 Arthrodesis status: Secondary | ICD-10-CM

## 2017-11-30 DIAGNOSIS — R202 Paresthesia of skin: Secondary | ICD-10-CM

## 2017-11-30 DIAGNOSIS — G629 Polyneuropathy, unspecified: Secondary | ICD-10-CM

## 2017-11-30 DIAGNOSIS — G8929 Other chronic pain: Secondary | ICD-10-CM

## 2017-11-30 MED ORDER — DICLOFENAC SODIUM 75 MG PO TBEC: 75.0000 mg | DELAYED_RELEASE_TABLET | Freq: Two times a day (BID) | ORAL | 0 refills | Status: DC

## 2017-11-30 NOTE — Patient Instructions (Addendum)
I am uncertain what is causing the numbness in your hands and feet.  Laboratory testing is being done to try and see if there is any cause that I can determine.  We will try to let you know the results soon.  Since you have had surgery on your neck 10 years ago, I think it is worth seeing Dr. Wynetta Emery back to assess whether a problem in the neck could be causing the numbness in the hands and feet.  A referral has been made, and someone will contact you with that referral over the next week.  If you do not hear from that referral call back and asked to speak to the referrals desk.  In the meanwhile, take diclofenac 1 twice daily at breakfast and at supper for inflammation and we will see if that helps.  If it causes stomach upset please discontinue it.  Return as needed.    If you have lab work done today you will be contacted with your lab results within the next 2 weeks.  If you have not heard from [place] then please contact us. The fastest way to get your results is to register for My Chart.   IF you received an x-ray today, you will receive an invoice from [facility]. Please contact [facility] at [phone] with questions or concerns regarding your invoice.   IF you received labwork today, you will receive an invoice from [place]. Please contact [facility] at [phone] with questions or concerns regarding your invoice.   Our billing staff will not be able to assist you with questions regarding bills from these companies.  You will be contacted with the lab results as soon as they are available. The fastest way to get your results is to activate your My Chart account. Instructions are located on the last page of this paperwork. If you have not heard from [place] regarding the results in 2 weeks, please contact this office.

## 2017-11-30 NOTE — Progress Notes (Signed)
Patient ID: Erika Perez, female    DOB: [birth year]  Age: 52 y.o. MRN: [id number]  Chief Complaint  Patient presents with  . Generalized Body Aches    pt states her feet and hands hurt or feel numb at times/ itching on body after showering, skin peels  . Back Pain    lower    Subjective:   Patient comes in today with complaints of numbness in both hands and both feet.  She works as a Medical illustrator.  She denies any injuries.  This is come on gradually.  She is bothered most in the morning and evening.  It seems like there may be some pain, but mostly numbness that seems to be her complaint.  She has not taken any medications for it.  She has no history of diabetes.  She does have a history of having had cervical surgery from an anterior approach, I believe a cervical fusion though I cannot find clear information in the chart regarding that.  This is been going on for some time, gradually bothering her more.  It seems to affect her work some though she still works regularly.  She is to [place], and her husband helped interpret for her though she speaks adequate [place].  The surgery was done by Dr. Donalee Citrin here in [place] at [facility] about 10 years ago, but I cannot find in the information about exactly what was done in the medical record.  Also complains of low back pain  Also complains of dermatographia some, when she scratches it leaves red streaks.  He has some itching.  Her hands probably have some pain in the joints but it is a little difficult to understand whether it is more the pain of the numbness.  No joint swellings are reported.  Current allergies, medications, problem list, past/family and social histories reviewed.  Objective:  BP 127/79   Pulse 76   Temp 98.9 F (37.2 C) (Oral)   Resp 16   Ht 5\' 5"  (1.651 m)   Wt 163 lb (73.9 kg)   SpO2 96%   BMI 27.12 kg/m   No major acute distress.  Extremities appear grossly normal.  Pulses are good.  Sensation seems  to be grossly normal with filament test symmetrical and good.  Nonspecific lumbar tenderness and pain.  There is dermatographia is him when the skin is gently scratch.  Lungs clear.  Heart regular without murmurs.  Assessment & Plan:   Assessment: 1. Numbness and tingling of left upper and lower extremity   2. History of fusion of cervical spine   3. Neuropathy   4. Dermatographism   5. Chronic low back pain without sciatica, unspecified back pain laterality       Plan: I am uncertain what is going on with her.  I think she ought to see the neurosurgeon back again if possible.  See instructions.  On further questioning they still insist that the problem is more than numbness than pain, and it is been going on for over 1 year. Orders Placed This Encounter  Procedures  . CBC  . Sedimentation rate  . Rheumatoid factor  . ANA  . Comprehensive metabolic panel  . Ambulatory referral to Neurosurgery    Referral Priority:   Routine    Referral Type:   Surgical    Referral Reason:   Specialty Services Required    Referred to Provider:   Donalee Citrin, MD    Requested Specialty:  Neurosurgery    Number of Visits Requested:   1    Meds ordered this encounter  Medications  . diclofenac (VOLTAREN) 75 MG EC tablet    Sig: Take 1 tablet (75 mg total) by mouth 2 (two) times daily.    Dispense:  30 tablet    Refill:  0         Patient Instructions   I am uncertain what is causing the numbness in your hands and feet.  Laboratory testing is being done to try and see if there is any cause that I can determine.  We will try to let you know the results soon.  Since you have had surgery on your neck 10 years ago, I think it is worth seeing Dr. Wynetta Emery back to assess whether a problem in the neck could be causing the numbness in the hands and feet.  A referral has been made, and someone will contact you with that referral over the next week.  If you do not hear from that referral call back  and asked to speak to the referrals desk.  In the meanwhile, take diclofenac 1 twice daily at breakfast and at supper for inflammation and we will see if that helps.  If it causes stomach upset please discontinue it.  Return as needed.    If you have lab work done today you will be contacted with your lab results within the next 2 weeks.  If you have not heard from [place] then please contact us. The fastest way to get your results is to register for My Chart.   IF you received an x-ray today, you will receive an invoice from [facility]. Please contact [facility] at [phone] with questions or concerns regarding your invoice.   IF you received labwork today, you will receive an invoice from [place]. Please contact [facility] at [phone] with questions or concerns regarding your invoice.   Our billing staff will not be able to assist you with questions regarding bills from these companies.  You will be contacted with the lab results as soon as they are available. The fastest way to get your results is to activate your My Chart account. Instructions are located on the last page of this paperwork. If you have not heard from [place] regarding the results in 2 weeks, please contact this office.         Return if symptoms worsen or fail to improve.   Janace Hoard, MD 11/30/2017

## 2017-12-01 LAB — COMPREHENSIVE METABOLIC PANEL
ALT: 9 IU/L (ref 0–32)
AST: 17 IU/L (ref 0–40)
Albumin/Globulin Ratio: 1.6 (ref 1.2–2.2)
Albumin: 4.2 g/dL (ref 3.5–5.5)
Alkaline Phosphatase: 89 IU/L (ref 39–117)
BILIRUBIN TOTAL: 0.4 mg/dL (ref 0.0–1.2)
BUN/Creatinine Ratio: 13 (ref 9–23)
BUN: 9 mg/dL (ref 6–24)
CHLORIDE: 104 mmol/L (ref 96–106)
CO2: 25 mmol/L (ref 20–29)
Calcium: 9.1 mg/dL (ref 8.7–10.2)
Creatinine, Ser: 0.68 mg/dL (ref 0.57–1.00)
GFR calc non Af Amer: 102 mL/min/{1.73_m2} (ref >59)
GFR, EST AFRICAN AMERICAN: 118 mL/min/{1.73_m2} (ref >59)
GLUCOSE: 80 mg/dL (ref 65–99)
Globulin, Total: 2.7 g/dL (ref 1.5–4.5)
Potassium: 4.8 mmol/L (ref 3.5–5.2)
Sodium: 144 mmol/L (ref 134–144)
TOTAL PROTEIN: 6.9 g/dL (ref 6.0–8.5)

## 2017-12-01 LAB — CBC
HEMATOCRIT: 39 % (ref 34.0–46.6)
Hemoglobin: 12 g/dL (ref 11.1–15.9)
MCH: 27.9 pg (ref 26.6–33.0)
MCHC: 30.8 g/dL — ABNORMAL LOW (ref 31.5–35.7)
MCV: 91 fL (ref 79–97)
PLATELETS: 280 10*3/uL (ref 150–450)
RBC: 4.3 x10E6/uL (ref 3.77–5.28)
RDW: 13.1 % (ref 12.3–15.4)
WBC: 5.3 10*3/uL (ref 3.4–10.8)

## 2017-12-01 LAB — RHEUMATOID FACTOR: Rhuematoid fact SerPl-aCnc: <10 IU/mL (ref 0.0–13.9)

## 2017-12-01 LAB — ANA: ANA: NEGATIVE

## 2017-12-01 LAB — SEDIMENTATION RATE: Sed Rate: 12 mm/hr (ref 0–40)

## 2018-12-25 ENCOUNTER — Encounter (HOSPITAL_COMMUNITY): Payer: Self-pay

## 2018-12-25 ENCOUNTER — Ambulatory Visit (HOSPITAL_COMMUNITY)
Admission: EM | Admit: 2018-12-25 | Discharge: 2018-12-25 | Disposition: A | Discharge status: Home / Self Care | Payer: 59 | Attending: Family Medicine | Admitting: Family Medicine

## 2018-12-25 ENCOUNTER — Other Ambulatory Visit: Payer: Self-pay

## 2018-12-25 DIAGNOSIS — R42 Dizziness and giddiness: Secondary | ICD-10-CM | POA: Diagnosis not present

## 2018-12-25 MED ORDER — MECLIZINE HCL 12.5 MG PO TABS: 12.5000 mg | ORAL_TABLET | Freq: Three times a day (TID) | ORAL | 0 refills | Status: DC | PRN

## 2018-12-25 NOTE — ED Provider Notes (Signed)
[place]    CSN: [id number] Arrival date & time: 12/25/18  1035      History   Chief Complaint Chief Complaint  Patient presents with  . Dizziness  . Headache    HPI Erika Perez is a 53 y.o. female.   Complains of feeling the room is spinning.  Some nausea.  Denies ear symptoms such as tinnitus or hearing loss.  Denies palpitations.  No history of similar symptoms.  There does seem to be positional component to dizziness.  HPI  History reviewed. No pertinent past medical history.  Patient Active Problem List   Diagnosis Date Noted  . EFFUSION/SWELLING OF JOINT, UNSPECIFIED 04/30/2006    Past Surgical History:  Procedure Laterality Date  . CESAREAN SECTION    . SPINE SURGERY      OB History    Gravida  2   Para  2   Term      Preterm      AB      Living  2     SAB      TAB      Ectopic      Multiple      Live Births  2            Home Medications    Prior to Admission medications   Medication Sig Start Date End Date Taking? Authorizing Provider  amoxicillin (AMOXIL) 875 MG tablet Take 1 tablet (875 mg total) by mouth 2 (two) times daily. Patient not taking: Reported on 11/13/2014 05/23/14   Robyn Haber, MD  diclofenac (VOLTAREN) 75 MG EC tablet Take 1 tablet (75 mg total) by mouth 2 (two) times daily. 11/30/17   Posey Boyer, MD  HYDROcodone-homatropine [facility]) 5-1.5 MG/5ML syrup Take 5 mLs by mouth every 8 (eight) hours as needed for cough. Patient not taking: Reported on 11/13/2014 05/23/14   Robyn Haber, MD  naproxen sodium (ANAPROX) 220 MG tablet Take 220 mg by mouth 2 (two) times daily with a meal.    [provider]    Family History History reviewed. No pertinent family history.  Social History Social History   Tobacco Use  . Smoking status: Never Smoker  . Smokeless tobacco: Never Used  Substance Use Topics  . Alcohol use: No  . Drug use: No     Allergies   Patient has no known  allergies.   Review of Systems Review of Systems  Gastrointestinal: Positive for nausea.  Neurological: Positive for dizziness.     Physical Exam Triage Vital Signs ED Triage Vitals  Enc Vitals Group     BP 12/25/18 1209 (!) 131/91     Pulse Rate 12/25/18 1209 85     Resp 12/25/18 1209 16     Temp 12/25/18 1209 98.5 F (36.9 C)     Temp Source 12/25/18 1209 Oral     SpO2 12/25/18 1209 100 %     Weight --      Height --      Head Circumference --      Peak Flow --      Pain Score 12/25/18 1204 4     Pain Loc --      Pain Edu? --      Excl. in [place]? --    No data found.  Updated Vital Signs BP (!) 131/91 (BP Location: Left Arm)   Pulse 85   Temp 98.5 F (36.9 C) (Oral)   Resp 16  SpO2 100%   Visual Acuity Right Eye Distance:   Left Eye Distance:   Bilateral Distance:    Right Eye Near:   Left Eye Near:    Bilateral Near:     Physical Exam Vitals signs and nursing note reviewed.  Constitutional:      Appearance: She is well-developed.  HENT:     Head: Normocephalic.     Comments: Ear exam within normal limits Eyes:     Extraocular Movements:     Right eye: No nystagmus.     Left eye: No nystagmus.  Cardiovascular:     Rate and Rhythm: Normal rate and regular rhythm.  Neurological:     Mental Status: She is alert.     Coordination: Romberg sign negative.     Gait: Gait normal.      UC Treatments / Results  Labs (all labs ordered are listed, but only abnormal results are displayed) Labs Reviewed - No data to display  EKG   Radiology No results found.  Procedures Procedures (including critical care time)  Medications Ordered in UC Medications - No data to display  Initial Impression / Assessment and Plan / UC Course  I have reviewed the triage vital signs and the nursing notes.  Pertinent labs & imaging results that were available during my care of the patient were reviewed by me and considered in my medical decision making (see chart  for details).     Dizziness, likely related to inner ear infection. Final Clinical Impressions(s) / UC Diagnoses   Final diagnoses:  None   Discharge Instructions   None    ED Prescriptions    None     PDMP not reviewed this encounter.   Frederica Kuster, MD 12/25/18 [phone]

## 2018-12-25 NOTE — ED Triage Notes (Signed)
Pt states she feels the room is spinning when she bend over. Pt also states having mild headaches x 4 days.

## 2019-03-01 ENCOUNTER — Encounter (HOSPITAL_COMMUNITY): Payer: Self-pay | Admitting: Emergency Medicine

## 2019-03-01 ENCOUNTER — Ambulatory Visit (HOSPITAL_COMMUNITY)
Admission: EM | Admit: 2019-03-01 | Discharge: 2019-03-01 | Disposition: A | Discharge status: Home / Self Care | Payer: 59 | Attending: Family Medicine | Admitting: Family Medicine

## 2019-03-01 ENCOUNTER — Other Ambulatory Visit: Payer: Self-pay

## 2019-03-01 DIAGNOSIS — R52 Pain, unspecified: Secondary | ICD-10-CM

## 2019-03-01 DIAGNOSIS — U071 COVID-19: Secondary | ICD-10-CM | POA: Diagnosis not present

## 2019-03-01 DIAGNOSIS — R430 Anosmia: Secondary | ICD-10-CM | POA: Diagnosis not present

## 2019-03-01 DIAGNOSIS — J069 Acute upper respiratory infection, unspecified: Secondary | ICD-10-CM | POA: Diagnosis not present

## 2019-03-01 DIAGNOSIS — R5383 Other fatigue: Secondary | ICD-10-CM

## 2019-03-01 DIAGNOSIS — R5381 Other malaise: Secondary | ICD-10-CM | POA: Diagnosis not present

## 2019-03-01 DIAGNOSIS — Z20828 Contact with and (suspected) exposure to other viral communicable diseases: Secondary | ICD-10-CM

## 2019-03-01 MED ORDER — BENZONATATE 100 MG PO CAPS: 100.0000 mg | ORAL_CAPSULE | Freq: Three times a day (TID) | ORAL | 0 refills | Status: DC | PRN

## 2019-03-01 MED ORDER — PROMETHAZINE-DM 6.25-15 MG/5ML PO SYRP: 5.0000 mL | ORAL_SOLUTION | Freq: Every evening | ORAL | 0 refills | Status: DC | PRN

## 2019-03-01 NOTE — ED Triage Notes (Signed)
Body aches, loss of smell, fatigue, 1 week of symptoms.

## 2019-03-01 NOTE — Discharge Instructions (Addendum)
We will manage this as a viral syndrome. For sore throat or cough try using a honey-based tea. Use 3 teaspoons of honey with juice squeezed from half lemon. Place shaved pieces of ginger into 1/2-1 cup of water and warm over stove top. Then mix the ingredients and repeat every 4 hours as needed. Please take Tylenol 500mg-650mg every 6 hours. Hydrate very well with at least 2 liters of water. Eat light meals such as soups to replenish electrolytes and soft fruits, veggies. Start an antihistamine like Zyrtec (cetirizine) at 10mg daily for postnasal drainage, sinus congestion.  You can take this together with pseudoephedrine (Sudafed) at a dose of 30 mg 3 times a day or twice daily as needed for the same kind of congestion.   

## 2019-03-01 NOTE — ED Provider Notes (Signed)
[place]   MRN: [id number] DOB: 05/31/1965  Subjective:   Erika Perez is a 53 y.o. female presenting for 4-day 5-day history of acute onset persistent moderate malaise.  ROS as below.  Has used Tylenol and ibuprofen with some relief.  Patient's employer would like him to get tested for COVID-19.  Patient admits that she was around some family this past week.  Denies any known COVID-19 contacts.  Denies taking chronic medications, having chronic conditions.  No Known Allergies  History reviewed. No pertinent past medical history.   Past Surgical History:  Procedure Laterality Date  . CESAREAN SECTION    . SPINE SURGERY      No family history on file.  Social History   Tobacco Use  . Smoking status: Never Smoker  . Smokeless tobacco: Never Used  Substance Use Topics  . Alcohol use: No  . Drug use: No    Review of Systems  Constitutional: Positive for malaise/fatigue. Negative for fever.  HENT: Negative for congestion, ear pain, sinus pain and sore throat.   Eyes: Negative for discharge and redness.  Respiratory: Positive for cough. Negative for hemoptysis, shortness of breath and wheezing.   Cardiovascular: Positive for chest pain (with coughing, anteriorly over mid sternum).  Gastrointestinal: Negative for abdominal pain, diarrhea, nausea and vomiting.  Genitourinary: Negative for dysuria, flank pain and hematuria.  Musculoskeletal: Positive for myalgias.  Skin: Negative for rash.  Neurological: Negative for dizziness, weakness and headaches.  Psychiatric/Behavioral: Negative for depression and substance abuse.     Objective:   Vitals: BP (!) 142/90   Pulse 71   Temp 98.9 F (37.2 C) (Oral)   Resp 16   SpO2 98%   Physical Exam Constitutional:      General: She is not in acute distress.    Appearance: Normal appearance. She is well-developed. She is not ill-appearing, toxic-appearing or diaphoretic.  HENT:     Head: Normocephalic and  atraumatic.     Right Ear: Tympanic membrane and ear canal normal. No drainage or tenderness. No middle ear effusion. Tympanic membrane is not erythematous.     Left Ear: Tympanic membrane and ear canal normal. No drainage or tenderness.  No middle ear effusion. Tympanic membrane is not erythematous.     Nose: Nose normal. No congestion or rhinorrhea.     Mouth/Throat:     Mouth: Mucous membranes are moist. No oral lesions.     Pharynx: No pharyngeal swelling, oropharyngeal exudate, posterior oropharyngeal erythema or uvula swelling.     Tonsils: No tonsillar exudate or tonsillar abscesses.  Eyes:     Extraocular Movements: Extraocular movements intact.     Right eye: Normal extraocular motion.     Left eye: Normal extraocular motion.     Conjunctiva/sclera: Conjunctivae normal.     Pupils: Pupils are equal, round, and reactive to light.  Cardiovascular:     Rate and Rhythm: Normal rate and regular rhythm.     Pulses: Normal pulses.     Heart sounds: Normal heart sounds. No murmur. No friction rub. No gallop.   Pulmonary:     Effort: Pulmonary effort is normal. No respiratory distress.     Breath sounds: Normal breath sounds. No stridor. No wheezing, rhonchi or rales.  Musculoskeletal:     Cervical back: Normal range of motion and neck supple.  Lymphadenopathy:     Cervical: No cervical adenopathy.  Skin:    General: Skin is warm and dry.  Findings: No rash.  Neurological:     General: No focal deficit present.     Mental Status: She is alert and oriented to person, place, and time.  Psychiatric:        Mood and Affect: Mood normal.        Behavior: Behavior normal.        Thought Content: Thought content normal.      Assessment and Plan :   1. Viral URI with cough   2. Body aches   3. Malaise and fatigue   4. Loss of sense of smell     Will manage for viral illness such as viral URI, viral rhinitis, possible COVID-19. Counseled patient on nature of COVID-19  including modes of transmission, diagnostic testing, management and supportive care.  Offered symptomatic relief. COVID 19 testing is pending. Counseled patient on potential for adverse effects with medications prescribed/recommended today, ER and return-to-clinic precautions discussed, patient verbalized understanding.     Wallis Bamberg, PA-C 03/01/19 1249

## 2019-03-03 ENCOUNTER — Telehealth (HOSPITAL_COMMUNITY): Payer: Self-pay | Admitting: Emergency Medicine

## 2019-03-03 LAB — NOVEL CORONAVIRUS, NAA (HOSP ORDER, SEND-OUT TO REF LAB; TAT 18-24 HRS): SARS-CoV-2, NAA: DETECTED — AB

## 2019-03-03 NOTE — Telephone Encounter (Signed)

## 2019-09-09 ENCOUNTER — Telehealth: Payer: Self-pay | Admitting: Registered Nurse

## 2019-09-09 ENCOUNTER — Encounter: Payer: Self-pay | Admitting: Registered Nurse

## 2019-09-09 ENCOUNTER — Other Ambulatory Visit: Payer: Self-pay

## 2019-09-09 ENCOUNTER — Ambulatory Visit: Payer: 59 | Admitting: Registered Nurse

## 2019-09-09 VITALS — BP 114/80 | HR 85 | Temp 97.3°F | Resp 18 | Ht 65.0 in | Wt 169.8 lb

## 2019-09-09 DIAGNOSIS — Z7689 Persons encountering health services in other specified circumstances: Secondary | ICD-10-CM | POA: Diagnosis not present

## 2019-09-09 DIAGNOSIS — R202 Paresthesia of skin: Secondary | ICD-10-CM

## 2019-09-09 NOTE — Telephone Encounter (Signed)
Please Advise

## 2019-09-09 NOTE — Progress Notes (Signed)
Established Patient Office Visit  Subjective:  Patient ID: Erika Perez, female    DOB: 21-Aug-1965  Age: 54 y.o. MRN: [id number]  CC:  Chief Complaint  Patient presents with   Numbness    patient is experiencing some numbness and tingling in her arms, legs and feet for about a month. She states that her legs and feet gets very cold. Per patient she has tried to massage her arms but still having numbness    HPI H PRIEM B Sao presents for visit to establish care. Histories reviewed. No pertinent updates.  Pt complains of numbness and tingling in arms and legs. Worst is in both thumbs. Worse in morning, sometimes has clicking sounds in thumbs in middle joint. Pain does not seem to affect large joints as much. Muscular pain more frequently in legs. Feels weak but not off balance. Of note, has had c spine surgery in the past - last time she was seen in our office was by Dr Alwyn Ren, who suggested she return to see her neurosurgeon. However, she did not make this appt and has not followed up with her surgeon since.  Otherwise no concerns. Here today with husband who is helping translate, though pt has serviceable [place]  History reviewed. No pertinent past medical history.  Past Surgical History:  Procedure Laterality Date   CESAREAN SECTION     SPINE SURGERY      History reviewed. No pertinent family history.  Social History   Socioeconomic History   Marital status: Married    Spouse name: Not on file   Number of children: 2   Years of education: Not on file   Highest education level: Not on file  Occupational History   Not on file  Tobacco Use   Smoking status: Never Smoker   Smokeless tobacco: Never Used  Vaping Use   Vaping Use: Never used  Substance and Sexual Activity   Alcohol use: No   Drug use: No   Sexual activity: Not on file  Other Topics Concern   Not on file  Social History Narrative   Not on file   Social Determinants of Health    Financial Resource Strain:    Difficulty of Paying Living Expenses:   Food Insecurity:    Worried About Programme researcher, broadcasting/film/video in the Last Year:    Barista in the Last Year:   Transportation Needs:    Freight forwarder (Medical):    Lack of Transportation (Non-Medical):   Physical Activity:    Days of Exercise per Week:    Minutes of Exercise per Session:   Stress:    Feeling of Stress :   Social Connections:    Frequency of Communication with Friends and Family:    Frequency of Social Gatherings with Friends and Family:    Attends Religious Services:    Active Member of Clubs or Organizations:    Attends Engineer, structural:    Marital Status:   Intimate Partner Violence:    Fear of Current or Ex-Partner:    Emotionally Abused:    Physically Abused:    Sexually Abused:     Outpatient Medications Prior to Visit  Medication Sig Dispense Refill   benzonatate (TESSALON) 100 MG capsule Take 1-2 capsules (100-200 mg total) by mouth 3 (three) times daily as needed. (Patient not taking: Reported on 09/09/2019) 60 capsule 0   promethazine-dextromethorphan (PROMETHAZINE-DM) 6.25-15 MG/5ML syrup Take 5 mLs by mouth at bedtime  as needed for cough. (Patient not taking: Reported on 09/09/2019) 100 mL 0   No facility-administered medications prior to visit.    No Known Allergies  ROS Review of Systems  Constitutional: Negative.   HENT: Negative.   Eyes: Negative.   Respiratory: Negative.   Cardiovascular: Negative.   Gastrointestinal: Negative.   Endocrine: Negative.   Genitourinary: Negative.   Musculoskeletal: Negative.   Skin: Negative.   Allergic/Immunologic: Negative.   Neurological: Positive for weakness and numbness. Negative for dizziness, tremors, seizures, syncope, facial asymmetry, speech difficulty, light-headedness and headaches.  Hematological: Negative.   Psychiatric/Behavioral: Negative.   All other systems reviewed and  are negative.     Objective:    Physical Exam Vitals and nursing note reviewed.  Constitutional:      General: She is not in acute distress.    Appearance: Normal appearance. She is normal weight. She is not ill-appearing, toxic-appearing or diaphoretic.  Cardiovascular:     Rate and Rhythm: Normal rate and regular rhythm.  Pulmonary:     Effort: Pulmonary effort is normal. No respiratory distress.  Skin:    General: Skin is warm and dry.     Capillary Refill: Capillary refill takes less than 2 seconds.     Coloration: Skin is not jaundiced or pale.     Findings: No bruising, erythema, lesion or rash.  Neurological:     General: No focal deficit present.     Mental Status: She is alert and oriented to person, place, and time. Mental status is at baseline.  Psychiatric:        Mood and Affect: Mood normal.        Behavior: Behavior normal.        Thought Content: Thought content normal.        Judgment: Judgment normal.     BP 114/80    Pulse 85    Temp (!) 97.3 F (36.3 C) (Temporal)    Resp 18    Ht 5\' 5"  (1.651 m)    Wt 169 lb 12.8 oz (77 kg)    SpO2 97%    BMI 28.26 kg/m  Wt Readings from Last 3 Encounters:  09/09/19 169 lb 12.8 oz (77 kg)  11/30/17 163 lb (73.9 kg)  11/17/16 155 lb (70.3 kg)     There are no preventive care reminders to display for this patient.  There are no preventive care reminders to display for this patient.  No results found for: TSH Lab Results  Component Value Date   WBC 5.3 11/30/2017   HGB 12.0 11/30/2017   HCT 39.0 11/30/2017   MCV 91 11/30/2017   PLT 280 11/30/2017   Lab Results  Component Value Date   NA 144 11/30/2017   K 4.8 11/30/2017   CO2 25 11/30/2017   GLUCOSE 80 11/30/2017   BUN 9 11/30/2017   CREATININE 0.68 11/30/2017   BILITOT 0.4 11/30/2017   ALKPHOS 89 11/30/2017   AST 17 11/30/2017   ALT 9 11/30/2017   PROT 6.9 11/30/2017   ALBUMIN 4.2 11/30/2017   CALCIUM 9.1 11/30/2017   Lab Results  Component  Value Date   CHOL 141 06/13/2013   Lab Results  Component Value Date   HDL 51 06/13/2013   Lab Results  Component Value Date   LDLCALC 80 06/13/2013   Lab Results  Component Value Date   TRIG 52 06/13/2013   Lab Results  Component Value Date   CHOLHDL 2.8 06/13/2013   No results  found for: HGBA1C    Assessment & Plan:   Problem List Items Addressed This Visit    None    Visit Diagnoses    Paresthesia    -  Primary   Relevant Orders   CBC   Comprehensive metabolic panel   Vitamin D, 25-hydroxy   Vitamin B12   Magnesium      No orders of the defined types were placed in this encounter.   Follow-up: No follow-ups on file.   PLAN  Will collect labs. ANA negative within 2 years ago, given steady nature of symptoms as well as no defining symptoms of RA, will not redraw. I more suspect vit D deficiency or a complication from her surgery - I again urged that she needs to contact her neurosurgeon for a follow up   Follow up on labs as warranted  Patient encouraged to call clinic with any questions, comments, or concerns.  Janeece Agee, NP

## 2019-09-09 NOTE — Patient Instructions (Signed)
° ° ° °  If you have lab work done today you will be contacted with your lab results within the next 2 weeks.  If you have not heard from us then please contact us. The fastest way to get your results is to register for My Chart. ° ° °IF you received an x-ray today, you will receive an invoice from [facility]. Please contact [facility] at [phone] with questions or concerns regarding your invoice.  ° °IF you received labwork today, you will receive an invoice from [facility]. Please contact [facility] at [phone] with questions or concerns regarding your invoice.  ° °Our billing staff will not be able to assist you with questions regarding bills from these companies. ° °You will be contacted with the lab results as soon as they are available. The fastest way to get your results is to activate your My Chart account. Instructions are located on the last page of this paperwork. If you have not heard from us regarding the results in 2 weeks, please contact this office. °  ° ° ° °

## 2019-09-09 NOTE — Telephone Encounter (Signed)
Pt was seen today  . Went by pharmacy on [address] and they don't have anything for him at this location [facility] [phone] - [place], [place] - [address] AT [facility] [address]  [address] [place] [address]  Phone:  [phone] Fax:  [phone]   Pt is wanting medications . I dont see any

## 2019-09-09 NOTE — Telephone Encounter (Signed)
Called patient per Erika Perez / Dr. [name] needs to review lab results before making a decision on new meds .

## 2019-09-10 LAB — CBC
Hematocrit: 39.1 % (ref 34.0–46.6)
Hemoglobin: 12.6 g/dL (ref 11.1–15.9)
MCH: 28.4 pg (ref 26.6–33.0)
MCHC: 32.2 g/dL (ref 31.5–35.7)
MCV: 88 fL (ref 79–97)
Platelets: 269 10*3/uL (ref 150–450)
RBC: 4.44 x10E6/uL (ref 3.77–5.28)
RDW: 12.2 % (ref 11.7–15.4)
WBC: 5.8 10*3/uL (ref 3.4–10.8)

## 2019-09-10 LAB — COMPREHENSIVE METABOLIC PANEL
ALT: 8 IU/L (ref 0–32)
AST: 19 IU/L (ref 0–40)
Albumin/Globulin Ratio: 1.2 (ref 1.2–2.2)
Albumin: 3.8 g/dL (ref 3.8–4.9)
Alkaline Phosphatase: 94 IU/L (ref 48–121)
BUN/Creatinine Ratio: 15 (ref 9–23)
BUN: 7 mg/dL (ref 6–24)
Bilirubin Total: 0.6 mg/dL (ref 0.0–1.2)
CO2: 26 mmol/L (ref 20–29)
Calcium: 8.8 mg/dL (ref 8.7–10.2)
Chloride: 107 mmol/L — ABNORMAL HIGH (ref 96–106)
Creatinine, Ser: 0.47 mg/dL — ABNORMAL LOW (ref 0.57–1.00)
GFR calc Af Amer: 131 mL/min/{1.73_m2} (ref >59)
GFR calc non Af Amer: 114 mL/min/{1.73_m2} (ref >59)
Globulin, Total: 3.1 g/dL (ref 1.5–4.5)
Glucose: 97 mg/dL (ref 65–99)
Potassium: 4.5 mmol/L (ref 3.5–5.2)
Sodium: 144 mmol/L (ref 134–144)
Total Protein: 6.9 g/dL (ref 6.0–8.5)

## 2019-09-10 LAB — VITAMIN D 25 HYDROXY (VIT D DEFICIENCY, FRACTURES): Vit D, 25-Hydroxy: 5.3 ng/mL — ABNORMAL LOW (ref 30.0–100.0)

## 2019-09-10 LAB — MAGNESIUM: Magnesium: 2 mg/dL (ref 1.6–2.3)

## 2019-09-10 LAB — VITAMIN B12: Vitamin B-12: 340 pg/mL (ref 232–1245)

## 2019-09-11 ENCOUNTER — Other Ambulatory Visit: Payer: Self-pay | Admitting: Registered Nurse

## 2019-09-11 DIAGNOSIS — E559 Vitamin D deficiency, unspecified: Secondary | ICD-10-CM

## 2019-09-11 MED ORDER — VITAMIN D (ERGOCALCIFEROL) 1.25 MG (50000 UNIT) PO CAPS: 50000.0000 [IU] | ORAL_CAPSULE | ORAL | 0 refills | Status: DC

## 2019-09-11 NOTE — Progress Notes (Signed)
If we could call Erika Perez,   Her vitamin D, as I suspected, is very, very low. She needs to start on a supplement of 50,000 units once a week for the next 8 weeks. I'd like to see her in about 10 weeks to recheck these numbers and see how she's feeling.  Thank you,  Jari Sportsman, NP

## 2019-11-17 ENCOUNTER — Other Ambulatory Visit: Payer: Self-pay | Admitting: Registered Nurse

## 2019-11-17 DIAGNOSIS — E559 Vitamin D deficiency, unspecified: Secondary | ICD-10-CM

## 2019-11-17 NOTE — Telephone Encounter (Signed)
Requested medication (s) are due for refill today: yes  Requested medication (s) are on the active medication list: yes  Last refill:  09/11/2019  Future visit scheduled: no  Notes to clinic:  this refill cannot be delegated    Requested Prescriptions  Pending Prescriptions Disp Refills   Vitamin D, Ergocalciferol, (DRISDOL) 1.25 MG (50000 UNIT) CAPS capsule [Pharmacy Med Name: VITAMIN D2 50,000IU (ERGO) CAP RX] 8 capsule 0    Sig: TAKE 1 CAPSULE BY MOUTH EVERY 7 DAYS      Endocrinology:  Vitamins - Vitamin D Supplementation Failed - 11/17/2019  5:23 PM      Failed - 50,000 IU strengths are not delegated      Failed - Phosphate in normal range and within 360 days    No results found for: PHOS        Failed - Vitamin D in normal range and within 360 days    Vit D, 25-Hydroxy  Date Value Ref Range Status  09/09/2019 5.3 (L) 30.0 - 100.0 ng/mL Final    Comment:    Vitamin D deficiency has been defined by the Institute of Medicine and an Endocrine Society practice guideline as a level of serum 25-OH vitamin D less than 20 ng/mL (1,2). The Endocrine Society went on to further define vitamin D insufficiency as a level between 21 and 29 ng/mL (2). 1. IOM (Institute of Medicine). 2010. Dietary reference    intakes for calcium and D. [place]: The    [employer]. 2. Holick MF, Binkley [place], Bischoff-Ferrari HA, et al.    Evaluation, treatment, and prevention of vitamin D    deficiency: an Endocrine Society clinical practice    guideline. JCEM. 2011 Jul; 96(7):1911-30.           Passed - Ca in normal range and within 360 days    Calcium  Date Value Ref Range Status  09/09/2019 8.8 8.7 - 10.2 mg/dL Final          Passed - Valid encounter within last 12 months    Recent Outpatient Visits           2 months ago Paresthesia   [facility], Richard, NP   1 year ago Numbness and tingling of left upper and lower extremity   Primary Care at  [facility], Sandria Bales, MD   3 years ago Abdominal pain, unspecified abdominal location   [facility] at [facility], [place], [place]   5 years ago Plantar fasciitis of left foot   Primary Care at [facility], Gwenlyn Found, MD   5 years ago Recurrent maxillary sinusitis, unspecified chronicity   [facility] at [facility], MD

## 2019-12-05 ENCOUNTER — Ambulatory Visit (INDEPENDENT_AMBULATORY_CARE_PROVIDER_SITE_OTHER): Payer: 59 | Admitting: Family Medicine

## 2019-12-05 ENCOUNTER — Other Ambulatory Visit: Payer: Self-pay

## 2019-12-05 ENCOUNTER — Encounter: Payer: Self-pay | Admitting: Family Medicine

## 2019-12-05 VITALS — BP 120/70 | HR 71 | Temp 98.1°F | Ht 61.0 in | Wt 173.4 lb

## 2019-12-05 DIAGNOSIS — Z7689 Persons encountering health services in other specified circumstances: Secondary | ICD-10-CM

## 2019-12-05 DIAGNOSIS — M79644 Pain in right finger(s): Secondary | ICD-10-CM | POA: Diagnosis not present

## 2019-12-05 DIAGNOSIS — E559 Vitamin D deficiency, unspecified: Secondary | ICD-10-CM | POA: Diagnosis not present

## 2019-12-05 DIAGNOSIS — M25641 Stiffness of right hand, not elsewhere classified: Secondary | ICD-10-CM

## 2019-12-05 MED ORDER — DICLOFENAC SODIUM 1 % EX GEL: 2.0000 g | Freq: Four times a day (QID) | CUTANEOUS | 1 refills | Status: DC

## 2019-12-05 NOTE — Patient Instructions (Addendum)
I am prescribing a topical medication for you to try on your thumb joint 4 times daily.   You can buy a thumb splint at the pharmacy to help stabilize your thumb joint.   You can try ice to the area as well for pain control.   Continue taking over the counter ibuprofen or Advil for now.   I am referring you to a specialist for this issue.

## 2019-12-05 NOTE — Progress Notes (Signed)
   Subjective:    Patient ID: Erika Perez, female    DOB: [birth year], 54 y.o.   MRN: [id number]  HPI Chief Complaint  Patient presents with  . get established    get established, sore right thumb x a couple months- tried to go to the hand center but doesn't execpt pt's insurance   She is new to the practice and here to establish care.   Her husband is with her helping translate. She has a 2 month history of right thumb pain and swelling. No known injury. She reports her thumb often gets "stuck" and she has to pull it with her other hand.  States she has been taking an OTC pain medication.  States pain is interfering with her job. 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   Denies any other arthralgias or myalgias today. No other concerns.   Denies fever, chills, N/V/D. No numbness, tingling or weakness.    They state the hand center does not accept her insurance.   Severe vitamin D def- she has been taking 50,000 IUs of vitamin D once weekly for the past 8 weeks.     Review of Systems Pertinent positives and negatives in the history of present illness.     Objective:   Physical Exam Musculoskeletal:     Right elbow: Normal.     Right forearm: Normal.     Right wrist: No tenderness. Normal range of motion. Normal pulse.     Right hand: Tenderness present. Decreased range of motion. Decreased strength. Normal sensation. Normal capillary refill. Normal pulse.     Left hand: Normal.     Comments: Marked TTP over the 1st MCP joint and distal radius. Weakness noted to right hand. No erythema or edema.  RUE is neurovascularly intact.     BP 120/70   Pulse 71   Temp 98.1 F (36.7 C)   Ht 5\' 1"  (1.549 m)   Wt 173 lb 6.4 oz (78.7 kg)   SpO2 97%   BMI 32.76 kg/m      Assessment & Plan:  Pain of right thumb - Plan: diclofenac Sodium (VOLTAREN) 1 % GEL, Ambulatory referral to Hand Surgery  Stiffness of finger joint of right hand - Plan: diclofenac Sodium (VOLTAREN) 1 % GEL, Ambulatory  referral to Hand Surgery  Vitamin D deficiency  Encounter to establish care  Recommend conservative treatment including topical Voltaren gel, ice and wrist splint OTC. I will refer to ortho since hand specialist does not take her insurance.  Continue vitamin D. Declines labs today.

## 2019-12-13 ENCOUNTER — Ambulatory Visit (INDEPENDENT_AMBULATORY_CARE_PROVIDER_SITE_OTHER): Payer: 59 | Admitting: Orthopaedic Surgery

## 2019-12-13 ENCOUNTER — Encounter: Payer: Self-pay | Admitting: Orthopaedic Surgery

## 2019-12-13 DIAGNOSIS — M65311 Trigger thumb, right thumb: Secondary | ICD-10-CM

## 2019-12-13 MED ORDER — BUPIVACAINE HCL 0.25 % IJ SOLN
0.3300 mL | INTRAMUSCULAR | Status: AC | PRN
  Administered 2019-12-13: .33 mL

## 2019-12-13 MED ORDER — LIDOCAINE HCL 1 % IJ SOLN
1.0000 mL | INTRAMUSCULAR | Status: AC | PRN
  Administered 2019-12-13: 1 mL

## 2019-12-13 MED ORDER — METHYLPREDNISOLONE ACETATE 40 MG/ML IJ SUSP
13.3300 mg | INTRAMUSCULAR | Status: AC | PRN
  Administered 2019-12-13: 13.33 mg

## 2019-12-13 NOTE — Progress Notes (Signed)
   Office Visit Note   Patient: Erika Perez           Date of Birth: 04/17/1965           MRN: [id number] Visit Date: 12/13/2019              Requested by: Avanell Shackleton, NP-C [address] [place],  [place] [address] PCP: Avanell Shackleton, NP-C   Assessment & Plan: Visit Diagnoses:  1. Trigger finger of right thumb     Plan: Impression is right trigger thumb.  Based on our discussion of treatment options patient elected to have an injection today.  She tolerated this well.  Follow-up as needed.  Follow-Up Instructions: Return if symptoms worsen or fail to improve.   Orders:  Orders Placed This Encounter  Procedures  . Hand/UE Inj   No orders of the defined types were placed in this encounter.     Procedures: Hand/UE Inj: R thumb A1 for trigger finger on 12/13/2019 10:44 AM Indications: pain Details: 25 G needle Medications: 1 mL lidocaine 1 %; 0.33 mL bupivacaine 0.25 %; 13.33 mg methylPREDNISolone acetate 40 MG/ML      Clinical Data: No additional findings.   Subjective: Chief Complaint  Patient presents with  . Right Hand - Pain  . Right Thumb - Pain    HPI patient is a very pleasant right-hand-dominant 54 year old female who presents our clinic today with pain and triggering to the right thumb. 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 Symptoms are aggravated with working. No numbness, tingling or burning.  Review of Systems as detailed in HPI. All others reviewed and are negative.   Objective: Vital Signs: There were no vitals taken for this visit.  Physical Exam well-developed well-nourished female no acute distress. Alert oriented x3.  Ortho Exam right thumb exam shows marked tenderness and a palpable nodule at the A1 pulley. She does have reproducible triggering. She is neurovascular intact distally.  Specialty Comments:  No specialty comments available.  Imaging: No new  imaging   PMFS History: Patient Active Problem List   Diagnosis Date Noted  . Pain of right thumb 12/05/2019  . Vitamin D deficiency 12/05/2019  . Stiffness of finger joint of right hand 12/05/2019  . EFFUSION/SWELLING OF JOINT, UNSPECIFIED 04/30/2006   History reviewed. No pertinent past medical history.  History reviewed. No pertinent family history.  Past Surgical History:  Procedure Laterality Date  . CESAREAN SECTION    . SPINE SURGERY     Social History   Occupational History  . Not on file  Tobacco Use  . Smoking status: Never Smoker  . Smokeless tobacco: Never Used  Vaping Use  . Vaping Use: Never used  Substance and Sexual Activity  . Alcohol use: No  . Drug use: No  . Sexual activity: Not on file

## 2020-03-29 ENCOUNTER — Encounter: Payer: Self-pay | Admitting: Family Medicine

## 2020-03-29 ENCOUNTER — Other Ambulatory Visit: Payer: Self-pay

## 2020-03-29 ENCOUNTER — Telehealth (INDEPENDENT_AMBULATORY_CARE_PROVIDER_SITE_OTHER): Payer: 59 | Admitting: Family Medicine

## 2020-03-29 VITALS — Wt 160.0 lb

## 2020-03-29 DIAGNOSIS — T7840XA Allergy, unspecified, initial encounter: Secondary | ICD-10-CM

## 2020-03-29 DIAGNOSIS — L509 Urticaria, unspecified: Secondary | ICD-10-CM

## 2020-03-29 MED ORDER — PREDNISONE 10 MG (21) PO TBPK: ORAL_TABLET | Freq: Every day | ORAL | 0 refills | Status: DC

## 2020-03-29 MED ORDER — DIPHENHYDRAMINE HCL 25 MG PO CAPS: 25.0000 mg | ORAL_CAPSULE | Freq: Four times a day (QID) | ORAL | 0 refills | Status: DC | PRN

## 2020-03-29 NOTE — Progress Notes (Signed)
   Subjective:  Documentation for virtual audio encounter via telephone and then an in person visit with patient in her car in my parking lot.   The patient was located in her car. 2 patient identifiers used.  The provider was located in the office. The patient did consent to this visit and is aware of possible charges through their insurance for this visit.  The other persons participating in this telemedicine service were her husband.  Time spent on call was 14 minutes in on the phone and in person and in review of previous records 20 minutes total.  This virtual service is not related to other E/M service within previous 7 days.   Patient ID: Erika Perez, female    DOB: February 10, 1966, 55 y.o.   MRN: [id number]  HPI Chief Complaint  Patient presents with  . sick-    Chills, rash all over body started yesterday. Had cough a couple days ago.    Her husband is with her and helping with translation.  Complains of chills, itching and a rash all over her body since yesterday morning.   States she ate papaya and hot pepper.   She has not taken anything for her symptoms.   No angioedema, shortness of breath, wheezing, abdominal pain, N/V/D.   Denies fever, headache, dizziness, chest pain, or palpitations.    Review of Systems Pertinent positives and negatives in the history of present illness.     Objective:   Physical Exam Wt 160 lb (72.6 kg)   BMI 30.23 kg/m   Alert and oriented and in no acute distress. Normal respirations. Bilateral external ears with edema, hives to neck, chest and upper arms. Normal lips, tongue, OP exam.       Assessment & Plan:  Allergic reaction, initial encounter - Plan: predniSONE (STERAPRED UNI-PAK 21 TAB) 10 MG (21) TBPK tablet, diphenhydrAMINE (BENADRYL ALLERGY) 25 mg capsule  Urticaria - Plan: predniSONE (STERAPRED UNI-PAK 21 TAB) 10 MG (21) TBPK tablet, diphenhydrAMINE (BENADRYL ALLERGY) 25 mg capsule  She was thought to be having Covid  symptoms so the visit was scheduled as a virtual. Discussed that she appears to be having an allergic reaction to an unknown food or substance. No red flag symptoms. She will take Benadryl and oral steroid dose dose pak. Discussed when to seek immediate medical attention.

## 2020-06-29 ENCOUNTER — Other Ambulatory Visit: Payer: Self-pay

## 2020-06-29 ENCOUNTER — Encounter: Payer: Self-pay | Admitting: Family Medicine

## 2020-06-29 ENCOUNTER — Ambulatory Visit (INDEPENDENT_AMBULATORY_CARE_PROVIDER_SITE_OTHER): Payer: 59 | Admitting: Family Medicine

## 2020-06-29 VITALS — BP 110/70 | HR 69 | Temp 97.1°F | Wt 164.8 lb

## 2020-06-29 DIAGNOSIS — N3001 Acute cystitis with hematuria: Secondary | ICD-10-CM | POA: Diagnosis not present

## 2020-06-29 DIAGNOSIS — R350 Frequency of micturition: Secondary | ICD-10-CM | POA: Diagnosis not present

## 2020-06-29 DIAGNOSIS — R300 Dysuria: Secondary | ICD-10-CM

## 2020-06-29 LAB — POCT URINALYSIS DIP (PROADVANTAGE DEVICE)
Bilirubin, UA: NEGATIVE
Glucose, UA: NEGATIVE mg/dL
Ketones, POC UA: NEGATIVE mg/dL
Nitrite, UA: NEGATIVE
Protein Ur, POC: 30 mg/dL — AB
Specific Gravity, Urine: 1.02
Urobilinogen, Ur: NEGATIVE
pH, UA: 6 (ref 5.0–8.0)

## 2020-06-29 MED ORDER — NITROFURANTOIN MONOHYD MACRO 100 MG PO CAPS: 100.0000 mg | ORAL_CAPSULE | Freq: Two times a day (BID) | ORAL | 0 refills | Status: DC

## 2020-06-29 NOTE — Progress Notes (Signed)
Subjective:  Erika Perez is a 55 y.o. female who complains of possible urinary tract infection.  She has had symptoms for 1 week.  Symptoms include urinary urgency, frequency, lower abdoinal pain and dysuria.  Patient denies fever, chills, chest pain, back pain, N/V/D.  Last UTI was 3 years ago.   Using tea for current symptoms.    Patient does not have a history of recurrent UTI. Patient does not have a history of pyelonephritis.  No other aggravating or relieving factors.  No other c/o.  History reviewed. No pertinent past medical history.  ROS as in subjective   Reviewed allergies, medications, past medical, surgical, and social history.    Objective: Vitals:   06/29/20 0902  BP: 110/70  Pulse: 69  Temp: (!) 97.1 F (36.2 C)    General appearance: alert, no distress, WD/WN, female Abdomen: +bs, soft, non distended, TTP to bilateral lower abdomen and suprapubic, no rebound or guarding, no palpable masses Back: no CVA tenderness GU: not done      Laboratory:  Urine dipstick: 3+ for leukocyte esterase.  +blood, cloudy.      Assessment: Acute cystitis with hematuria - Plan: nitrofurantoin, macrocrystal-monohydrate, (MACROBID) 100 MG capsule  Urine frequency - Plan: POCT Urinalysis DIP (Proadvantage Device)  Burning with urination - Plan: POCT Urinalysis DIP (Proadvantage Device)    Plan: Discussed symptoms, diagnosis, possible complications, and usual course of illness.  Begin Macrobid advised increased water intake, can use OTC Tylenol for pain.    Advised that if she is worsening over the weekend she will need to be seen in an urgent care.  Urine culture was not sent  Call or return if worse or not improving.

## 2021-08-21 ENCOUNTER — Encounter: Payer: Self-pay | Admitting: Physician Assistant

## 2021-08-21 ENCOUNTER — Ambulatory Visit (INDEPENDENT_AMBULATORY_CARE_PROVIDER_SITE_OTHER): Payer: Commercial Managed Care - HMO | Admitting: Physician Assistant

## 2021-08-21 VITALS — BP 124/82 | HR 72 | Temp 98.6°F | Ht 61.0 in | Wt 166.8 lb

## 2021-08-21 DIAGNOSIS — R1013 Epigastric pain: Secondary | ICD-10-CM | POA: Diagnosis not present

## 2021-08-21 MED ORDER — FAMOTIDINE 40 MG PO TABS: 40.0000 mg | ORAL_TABLET | Freq: Every day | ORAL | 1 refills | Status: DC

## 2021-08-21 NOTE — Progress Notes (Unsigned)
   Acute Office Visit  Subjective:    Patient ID: Erika Perez, female    DOB: 1965-08-29, 56 y.o.   MRN: [id number]  Chief Complaint  Patient presents with   other    Throwing up and dizzy since last night, after eating got sick. Possible sciatic nerve pain on rt. Side almost every day    HPI Patient is in today for nausea and vomiting yesterday; after eating lunch yesterday she tasted a hot pepper then last night had nausea and vomiting and felt hot and dizzy; 1 episode of vomiting yesterday, none today; no fever; no travel no sick contacts; has not taken any OTC medicine  Outpatient Medications Prior to Visit  Medication Sig Dispense Refill   nitrofurantoin, macrocrystal-monohydrate, (MACROBID) 100 MG capsule Take 1 capsule (100 mg total) by mouth 2 (two) times daily. 10 capsule 0   No facility-administered medications prior to visit.    No Known Allergies  Review of Systems     Objective:    Physical Exam  BP 124/82   Pulse 72   Temp 98.6 F (37 C)   Wt 166 lb 12.8 oz (75.7 kg)   BMI 31.52 kg/m   Wt Readings from Last 3 Encounters:  08/21/21 166 lb 12.8 oz (75.7 kg)  06/29/20 164 lb 12.8 oz (74.8 kg)  03/29/20 160 lb (72.6 kg)    Results for orders placed or performed in visit on 06/29/20  POCT Urinalysis DIP (Proadvantage Device)  Result Value Ref Range   Color, UA yellow yellow   Clarity, UA cloudy (A) clear   Glucose, UA negative negative mg/dL   Bilirubin, UA negative negative   Ketones, POC UA negative negative mg/dL   Specific Gravity, Urine 1.020    Blood, UA small (A) negative   pH, UA 6.0 5.0 - 8.0   Protein Ur, POC =30 (A) negative mg/dL   Urobilinogen, Ur n    Nitrite, UA Negative Negative   Leukocytes, UA Large (3+) (A) Negative       Assessment & Plan:  There are no diagnoses linked to this encounter.   No orders of the defined types were placed in this encounter.   No follow-ups on file.  Jake Shark, PA-C

## 2021-08-21 NOTE — Patient Instructions (Addendum)
you can also take OTC antacids and OTC antigas capsules as needed; avoid stomach irritants like tomatoes, oranges, lemons, limes, spicy foods, greasy foods, alcohol, tobacco products.   You can take OTC pain medicine as needed:  Tylenol (generic is acetamenophen)  Advil or Motrin (generic is ibuprofen) ALWAYS TAKE WITH FOOD or Aleve (generic is naprosyn sodium) ALWAYS TAKE WITH FOOD  Aspercreme with lidocaine  Muscle rubs like Biofreeze, IcyHot, Bengay, Pain patches like SalonPas  Voltaren gel (generic is Diclofenac sodium)  If symptoms get worse, please call the office for a follow up appointment if available, otherwise go to Urgent Care, call 911 / EMS, or go to the Emergency Department for further evaluation.

## 2021-08-28 ENCOUNTER — Telehealth: Payer: Self-pay

## 2021-08-28 NOTE — Telephone Encounter (Signed)
Her last office visit was for abdominal issues. She can try OTC dramamine (meclizine) for the dizziness, otherwise she will have to come back in for an in office visit.

## 2021-08-28 NOTE — Telephone Encounter (Signed)
Thank you :)

## 2021-08-28 NOTE — Telephone Encounter (Signed)
Pt. Husband called stating her dizziness is back again today and seems to be worse this time. He wanted to know if you could call her in something for this since she just saw you on 08/21/21 for the same issue or did you need to see her again.

## 2021-11-06 ENCOUNTER — Encounter: Payer: Self-pay | Admitting: Internal Medicine

## 2021-12-10 ENCOUNTER — Encounter: Payer: Self-pay | Admitting: Internal Medicine

## 2021-12-24 ENCOUNTER — Encounter: Payer: Self-pay | Admitting: Student

## 2021-12-24 ENCOUNTER — Ambulatory Visit (INDEPENDENT_AMBULATORY_CARE_PROVIDER_SITE_OTHER): Payer: Commercial Managed Care - HMO | Admitting: Student

## 2021-12-24 VITALS — BP 131/85 | HR 80 | Temp 97.9°F | Ht 61.0 in | Wt 169.0 lb

## 2021-12-24 DIAGNOSIS — M5441 Lumbago with sciatica, right side: Secondary | ICD-10-CM

## 2021-12-24 DIAGNOSIS — M5442 Lumbago with sciatica, left side: Secondary | ICD-10-CM

## 2021-12-24 DIAGNOSIS — Z9889 Other specified postprocedural states: Secondary | ICD-10-CM

## 2021-12-24 DIAGNOSIS — E669 Obesity, unspecified: Secondary | ICD-10-CM | POA: Diagnosis not present

## 2021-12-24 DIAGNOSIS — Z23 Encounter for immunization: Secondary | ICD-10-CM

## 2021-12-24 DIAGNOSIS — R7303 Prediabetes: Secondary | ICD-10-CM

## 2021-12-24 DIAGNOSIS — G8929 Other chronic pain: Secondary | ICD-10-CM

## 2021-12-24 DIAGNOSIS — M542 Cervicalgia: Secondary | ICD-10-CM

## 2021-12-24 DIAGNOSIS — K219 Gastro-esophageal reflux disease without esophagitis: Secondary | ICD-10-CM | POA: Diagnosis not present

## 2021-12-24 DIAGNOSIS — G8928 Other chronic postprocedural pain: Secondary | ICD-10-CM | POA: Insufficient documentation

## 2021-12-24 DIAGNOSIS — E663 Overweight: Secondary | ICD-10-CM | POA: Insufficient documentation

## 2021-12-24 DIAGNOSIS — L503 Dermatographic urticaria: Secondary | ICD-10-CM

## 2021-12-24 DIAGNOSIS — E559 Vitamin D deficiency, unspecified: Secondary | ICD-10-CM

## 2021-12-24 MED ORDER — PREGABALIN 25 MG PO CAPS
25.0000 mg | ORAL_CAPSULE | Freq: Two times a day (BID) | ORAL | 0 refills | Status: DC
Start: 2021-12-24 — End: 2022-09-17

## 2021-12-24 MED ORDER — FAMOTIDINE 40 MG PO TABS: 40.0000 mg | ORAL_TABLET | Freq: Every day | ORAL | 1 refills | Status: DC

## 2021-12-24 NOTE — Assessment & Plan Note (Addendum)
Chronic history of GERD, previously managed with pepcid. She has ran out of pepcid and is experiencing intermittent heartburn. Will refill this medication for her. Will check a CBC to ensure no anemia noted that would be suspicious for gastritis/upper GIB.  Plan: -pepcid daily -f/u CBC

## 2021-12-24 NOTE — Assessment & Plan Note (Addendum)
Patient with history of vitamin D deficiency, not currently taking supplementation. Vitamin D level was 5.3 in 2021, unclear if she was prescribed supplementation in the past. Will recheck level today.  Plan: -f/u vitamin d level -supplementation pending level  ADDENDUM: Vitamin D level of 12.6. Prescribed Vitamin D supplementation at 1000IU daily.

## 2021-12-24 NOTE — Progress Notes (Signed)
CC: establish care with PCP  HPI:  Erika Perez is a 56 y.o. female with history listed below presenting to the [facility] for establish care with new PCP. Please see individualized problem based charting for full HPI.  Past Medical History:  Diagnosis Date   Chronic low back pain with bilateral sciatica    Chronic neck pain with history of cervical spinal surgery    Dermatographia    GERD (gastroesophageal reflux disease)    Obesity, Class I, BMI 30-34.9    Vitamin D deficiency    Past Surgical History:  Procedure Laterality Date   CESAREAN SECTION     SPINE SURGERY      Medications: none  Allergies: none (seasonal allergies)  Family History: no known family history (family lives in [place], poor access to healthcare)  Social History   Socioeconomic History   Marital status: Married    Spouse name: Not on file   Number of children: 2   Years of education: Not on file   Highest education level: Not on file  Occupational History   Not on file  Tobacco Use   Smoking status: Never   Smokeless tobacco: Never  Vaping Use   Vaping Use: Never used  Substance and Sexual Activity   Alcohol use: No   Drug use: No   Sexual activity: Not on file  Other Topics Concern   Not on file  Social History Narrative   Works as a Scientist, forensic.   Social Determinants of Health   Financial Resource Strain: Not on file  Food Insecurity: Not on file  Transportation Needs: Not on file  Physical Activity: Not on file  Stress: Not on file  Social Connections: Not on file  Intimate Partner Violence: Not on file       Review of Systems:  Negative aside from that listed in individualized problem based charting.  Physical Exam:  Vitals:   12/24/21 1606  BP: 131/85  Pulse: 80  Temp: 97.9 F (36.6 C)  TempSrc: Oral  SpO2: 99%  Weight: 169 lb (76.7 kg)  Height: 5\' 1"  (1.549 m)   Physical Exam Constitutional:      Appearance: Normal appearance. She is obese. She is not  ill-appearing.  HENT:     Mouth/Throat:     Mouth: Mucous membranes are moist.     Pharynx: Oropharynx is clear. No oropharyngeal exudate.  Eyes:     General: No scleral icterus.    Extraocular Movements: Extraocular movements intact.     Conjunctiva/sclera: Conjunctivae normal.     Pupils: Pupils are equal, round, and reactive to light.  Neck:     Comments: Limited ROM of neck. Mild TTP of cervical spine. Cardiovascular:     Rate and Rhythm: Normal rate and regular rhythm.     Pulses: Normal pulses.     Heart sounds: Normal heart sounds. No murmur heard.    No friction rub. No gallop.  Pulmonary:     Effort: Pulmonary effort is normal.     Breath sounds: Normal breath sounds. No wheezing, rhonchi or rales.  Abdominal:     General: Bowel sounds are normal. There is no distension.     Palpations: Abdomen is soft.     Tenderness: There is no abdominal tenderness.  Musculoskeletal:        General: No swelling or tenderness. Normal range of motion.     Comments: Negative straight leg test bilaterally.   Skin:    General: Skin is  warm and dry.     Findings: No erythema or rash.  Neurological:     General: No focal deficit present.     Mental Status: She is alert and oriented to person, place, and time.     Motor: No weakness.     Gait: Gait normal.     Comments: Mild numbness/tingling of bilateral anterior thighs down to knees.  Psychiatric:        Mood and Affect: Mood normal.        Behavior: Behavior normal.      Assessment & Plan:   See Encounters Tab for problem based charting.  Patient discussed with Dr. Antony Contras

## 2021-12-24 NOTE — Assessment & Plan Note (Signed)
She notes a long history of redness and swelling in areas of skin where she applies deep pressure and/or scratching that spontaneously resolve after a few hours. States that those areas tend to raise up (likely forming welts on skin). 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 Provided reassurance and recommended sensitive skin lotion to help with skin changes, but they would resolve spontaneously regardless. Do not believe patient needs a dermatology referral for this.  Plan: -sensitive skin lotion as needed

## 2021-12-24 NOTE — Patient Instructions (Addendum)
Ms. Barrientez,  It was a pleasure seeing you in the clinic today.   I have placed a referral to physical therapy. They will call you. I have prescribed pregabalin (lyrica) to help with sciatica pain. I have prescribed pepcid to help with heartburn. We are checking labs today. I will call you with the results. Please come back in 3 months.  Please call our clinic at [phone] if you have any questions or concerns. The best time to call is Monday-Friday from 9am-4pm, but there is someone available 24/7 at the same number. If you need medication refills, please notify your pharmacy one week in advance and they will send [place] a request.   Thank you for letting us take part in your care. We look forward to seeing you next time!

## 2021-12-24 NOTE — Assessment & Plan Note (Signed)
Patient with chronic low back pain with bilateral sciatica. She has chronic numbness/tingling in her hips radiating down to her knees bilaterally. No red flag symptoms. Straight leg testing was negative bilaterally on my exam. She notices this sensation more when she is at rest or walking up and down stairs. Will refer to physical therapy for strengthening exercises for relief and also prescribe low dose lyrica to help control neuropathic symptoms.  Plan: -referral to PT -lyrica 25mg  BID

## 2021-12-24 NOTE — Assessment & Plan Note (Signed)
Patient with Class I obesity with BMI 31.93. She is at higher risk of dyslipidemia and insulin resistance given obesity. She is due for screening labs so will obtain A1c, lipid panel, and BMP to ensure she does not have any comorbid conditions.  Plan: -encouraged lifestyle modifications for weight loss -f/u A1c, lipid panel, BMP

## 2021-12-24 NOTE — Assessment & Plan Note (Signed)
Difficult to gather accurate history but patient developed neck pain in the past due to some kind of cervical issue necessitating cervical spine surgery multiple years ago. On chart review, it appears that she had cervical surgery with Dr. Saintclair Halsted in 2009, unclear for what medical reason and exact surgery performed. She does have chronic neck pain with radiation to shoulders from this. Will refer her to physical therapy for strength training and neck exercises.  Plan: -referral to PT -lyrica 25mg  BID for neuropathic pain

## 2021-12-25 LAB — CBC
Hematocrit: 39 % (ref 34.0–46.6)
Hemoglobin: 12.5 g/dL (ref 11.1–15.9)
MCH: 28.4 pg (ref 26.6–33.0)
MCHC: 32.1 g/dL (ref 31.5–35.7)
MCV: 89 fL (ref 79–97)
Platelets: 271 10*3/uL (ref 150–450)
RBC: 4.4 x10E6/uL (ref 3.77–5.28)
RDW: 12.3 % (ref 11.7–15.4)
WBC: 7.3 10*3/uL (ref 3.4–10.8)

## 2021-12-25 LAB — HEMOGLOBIN A1C
Est. average glucose Bld gHb Est-mCnc: 117 mg/dL
Hgb A1c MFr Bld: 5.7 % — ABNORMAL HIGH (ref 4.8–5.6)

## 2021-12-25 LAB — BMP8+ANION GAP
Anion Gap: 13 mmol/L (ref 10.0–18.0)
BUN/Creatinine Ratio: 8 — ABNORMAL LOW (ref 9–23)
BUN: 6 mg/dL (ref 6–24)
CO2: 26 mmol/L (ref 20–29)
Calcium: 9.5 mg/dL (ref 8.7–10.2)
Chloride: 102 mmol/L (ref 96–106)
Creatinine, Ser: 0.71 mg/dL (ref 0.57–1.00)
Glucose: 90 mg/dL (ref 70–99)
Potassium: 4.8 mmol/L (ref 3.5–5.2)
Sodium: 141 mmol/L (ref 134–144)
eGFR: 101 mL/min/{1.73_m2} (ref >59)

## 2021-12-25 LAB — VITAMIN D 25 HYDROXY (VIT D DEFICIENCY, FRACTURES): Vit D, 25-Hydroxy: 12.6 ng/mL — ABNORMAL LOW (ref 30.0–100.0)

## 2021-12-26 NOTE — Progress Notes (Signed)
Internal Medicine Clinic Attending  Case discussed with Dr. Jinwala  At the time of the visit.  We reviewed the resident's history and exam and pertinent patient test results.  I agree with the assessment, diagnosis, and plan of care documented in the resident's note.  

## 2021-12-27 DIAGNOSIS — R7303 Prediabetes: Secondary | ICD-10-CM | POA: Insufficient documentation

## 2021-12-27 MED ORDER — VITAMIN D3 25 MCG (1000 UNIT) PO TABS: 1000.0000 [IU] | ORAL_TABLET | Freq: Every day | ORAL | 1 refills | Status: DC

## 2021-12-27 NOTE — Addendum Note (Signed)
Addended by: Virl Axe on: 12/27/2021 11:58 AM   Modules accepted: Orders

## 2021-12-27 NOTE — Assessment & Plan Note (Addendum)
Hemoglobin A1c of 5.7%. Given [place] dialect, our front desk staff [facility]) to contact patient to review lifestyle modifications, particularly limiting sugar intake and moderate intensity exercise for >150 minutes/week.

## 2022-09-17 ENCOUNTER — Encounter: Payer: Self-pay | Admitting: Student

## 2022-09-17 ENCOUNTER — Ambulatory Visit (INDEPENDENT_AMBULATORY_CARE_PROVIDER_SITE_OTHER): Payer: Commercial Managed Care - HMO | Admitting: Student

## 2022-09-17 VITALS — BP 108/91 | HR 76 | Temp 98.4°F | Ht 61.0 in | Wt 165.1 lb

## 2022-09-17 DIAGNOSIS — H9319 Tinnitus, unspecified ear: Secondary | ICD-10-CM | POA: Diagnosis not present

## 2022-09-17 DIAGNOSIS — M5441 Lumbago with sciatica, right side: Secondary | ICD-10-CM | POA: Diagnosis not present

## 2022-09-17 DIAGNOSIS — G8929 Other chronic pain: Secondary | ICD-10-CM | POA: Diagnosis not present

## 2022-09-17 DIAGNOSIS — E669 Obesity, unspecified: Secondary | ICD-10-CM

## 2022-09-17 DIAGNOSIS — M5442 Lumbago with sciatica, left side: Secondary | ICD-10-CM

## 2022-09-17 DIAGNOSIS — R7303 Prediabetes: Secondary | ICD-10-CM

## 2022-09-17 DIAGNOSIS — R208 Other disturbances of skin sensation: Secondary | ICD-10-CM

## 2022-09-17 MED ORDER — PREGABALIN 25 MG PO CAPS: 25.0000 mg | ORAL_CAPSULE | Freq: Two times a day (BID) | ORAL | 1 refills | Status: DC

## 2022-09-17 NOTE — Assessment & Plan Note (Addendum)
Patient presents with complaints of ongoing lower back pain with unilateral sciatica.  She said she took Lyrica for 1 month and did not use it again because she felt well.  Says she feels the sciatica has returned but on her right side this time, worse in the morning ,however denies any weakness or cold sensation on her right side.  On exam there are strong pulses in her right arm and feet, hand grip was firm and patient is able to do full range of motion without any issues. - Restart Lyrica 25mg  for a month - Follow up in 6 months

## 2022-09-17 NOTE — Progress Notes (Signed)
CC: Ringing in the left ear  HPI:  Erika Perez is a 57 y.o. female living with a history stated below and presents today for getting the left ear. Please see problem based assessment and plan for additional details.  Past Medical History:  Diagnosis Date   Chronic low back pain with bilateral sciatica    Chronic neck pain with history of cervical spinal surgery    Dermatographia    GERD (gastroesophageal reflux disease)    Obesity, Class I, BMI 30-34.9    Vitamin D deficiency     Current Outpatient Medications on File Prior to Visit  Medication Sig Dispense Refill   cholecalciferol (VITAMIN D3) 25 MCG (1000 UNIT) tablet Take 1 tablet (1,000 Units total) by mouth daily. 90 tablet 1   famotidine (PEPCID) 40 MG tablet Take 1 tablet (40 mg total) by mouth daily. 30 tablet 1   No current facility-administered medications on file prior to visit.    No family history on file.  Social History   Socioeconomic History   Marital status: Married    Spouse name: Not on file   Number of children: 2   Years of education: Not on file   Highest education level: Not on file  Occupational History   Not on file  Tobacco Use   Smoking status: Never   Smokeless tobacco: Never  Vaping Use   Vaping status: Never Used  Substance and Sexual Activity   Alcohol use: No   Drug use: No   Sexual activity: Not on file  Other Topics Concern   Not on file  Social History Narrative   Works as a Advertising account planner.   Social Determinants of Health   Financial Resource Strain: Not on file  Food Insecurity: Not on file  Transportation Needs: Not on file  Physical Activity: Not on file  Stress: Not on file  Social Connections: Not on file  Intimate Partner Violence: Not on file    Review of Systems: ROS negative except for what is noted on the assessment and plan.  Vitals:   09/17/22 1446  BP: (!) 108/91  Pulse: 76  Temp: 98.4 F (36.9 C)  TempSrc: Oral  SpO2: 100%  Weight: 165  lb 1.6 oz (74.9 kg)  Height: 5\' 1"  (1.549 m)    Physical Exam: Constitutional: [place] speaking woman,overweight , sitting comfortably in the chair with her husband Card: Strong pulses in her right arm and feet HENT: Patent ears , no hearing lost , no swollen lymph nodes or masses were appreciated in the neck.  MSK: upper extremities full range of motion , no staggering walking. 4/5 grip strength Skin: warm and dry Psych: normal mood and behavior  Assessment & Plan:   Unilateral subjective nonpulsatile tinnitus without hearing loss, otoscopic finding, neurologic deficit, or head trauma Patient presents in the clinic with complaints of ringing in the left ear that started 4 days ago while she was taking a shower.  She reports it happened suddenly and the ringing worsened over the next couple of days.  She however reports that she does not have the ringing anymore in the office at this time.  Of note, had dizziness and a fall about 5 to 6 years ago and she was told there was fluid in her left ears but she has not had any other issues with the ears since then until 4 days ago.  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  Ear exam did not reveal any wax in both ears, ears were patent with no blockages , no swollen lymph nodes or masses were appreciated in the neck.  There was no ear discharge or impaired hearing.  Patient is currently not taking any medications so medication induced tinnitus  is very low on my differential at this time.  Patient has no history of recent weight loss, excessive headaches, confusion, ear tumors is less likely at this time also.  Will defer an ENT consult at this time as vascular tinnitus is also less likely. Will reassure patient at this time and have her call us back when symptoms return.  Chronic low back pain  with bilateral sciatica Patient presents with complaints of ongoing lower back pain with unilateral sciatica.  She said she took Lyrica for 1 month and did not use it again because she felt well.  Says she feels the sciatica has returned but now only on her right side, that is worse in the morning ,however denies any weakness or cold sensation on her right side.  On exam there are strong pulses in her right arm and feet, hand grip was firm and patient is able to do full range of motion without any issues. - Restart Lyrica 25mg  for a month - Follow up in 6 months   Patient seen with Dr. Lavonna Monarch, M.D [facility] Phone: [phone] Date 09/17/2022 Time 6:15 PM

## 2022-09-17 NOTE — Assessment & Plan Note (Addendum)
Patient presents in the clinic with complaints of ringing in the left ear that started 4 days ago while she was taking a shower.  She reports it happened suddenly and the ringing worsened over the next couple of days.  She however reports that she does not have the ringing anymore in the office at this time.  Of note, had dizziness and a fall about 5 to 6 years ago and she was told there was fluid in her left ears but she has not had any other issues with the ears since then until 4 days ago.  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  Ear exam did not reveal any wax in both ears, ears were patent with no blockages , no swollen lymph nodes or masses were appreciated in the neck.  There was no ear discharge or impaired hearing.  Patient is currently not taking any medications so medication induced tinnitus  is very low on my differential at this time.  Patient has no history of recent weight loss, excessive headaches, confusion, ear tumors is less likely at this time also.  Will defer an ENT consult at this time as vascular tinnitus is also less likely. Will reassure patient at this time and have her call us back when symptoms return.

## 2022-09-17 NOTE — Patient Instructions (Addendum)
Thank you, Erika Perez for allowing [place] to provide your care today. Today we discussed your general health, ringing in your left ear and right side numbness or tingling  . I have sent you a prescription for lyrica. Take one pill twice daily like you used to. Please let us know how you do.   I have ordered the following labs for you:  Lab Orders         BMP8+Anion Gap         Hemoglobin A1c       Tests ordered today:    Referrals ordered today:   Referral Orders  No referral(s) requested today     I have ordered the following medication/changed the following medications:   Stop the following medications: Medications Discontinued During This Encounter  Medication Reason   pregabalin (LYRICA) 25 MG capsule Reorder     Start the following medications: Meds ordered this encounter  Medications   pregabalin (LYRICA) 25 MG capsule    Sig: Take 1 capsule (25 mg total) by mouth 2 (two) times daily.    Dispense:  60 capsule    Refill:  1     Follow up: 6 months   Remember:   Should you have any questions or concerns please call the internal medicine clinic at [phone].    Kathleen Lime, M.D [facility]

## 2022-09-19 LAB — BMP8+ANION GAP
Anion Gap: 16 mmol/L (ref 10.0–18.0)
BUN/Creatinine Ratio: 12 (ref 9–23)
BUN: 9 mg/dL (ref 6–24)
CO2: 25 mmol/L (ref 20–29)
Calcium: 9.3 mg/dL (ref 8.7–10.2)
Chloride: 101 mmol/L (ref 96–106)
Creatinine, Ser: 0.78 mg/dL (ref 0.57–1.00)
Glucose: 86 mg/dL (ref 70–99)
Potassium: 4.2 mmol/L (ref 3.5–5.2)
Sodium: 142 mmol/L (ref 134–144)
eGFR: 90 mL/min/{1.73_m2} (ref >59)

## 2022-09-19 LAB — HEMOGLOBIN A1C
Est. average glucose Bld gHb Est-mCnc: 123 mg/dL
Hgb A1c MFr Bld: 5.9 % — ABNORMAL HIGH (ref 4.8–5.6)

## 2022-09-24 ENCOUNTER — Encounter: Payer: Commercial Managed Care - HMO | Admitting: Student

## 2022-09-24 ENCOUNTER — Telehealth: Payer: Self-pay | Admitting: *Deleted

## 2022-09-24 NOTE — Telephone Encounter (Signed)
Following MyChart message sent to [facility]:  I read mychart . I had predibedeas should i have a medicine will take daily

## 2022-09-26 NOTE — Telephone Encounter (Signed)
Patient was called back with the help of Rcom Hme CMA with translation. A1c of 5.9 adequately discussed with the patient and treatment options given. Patient opted for lifestule modification and diet control. All concerns addressed .

## 2022-09-30 NOTE — Progress Notes (Signed)
Internal Medicine Clinic Attending  I was physically present during the key portions of the resident provided service and participated in the medical decision making of patient's management care. I reviewed pertinent patient test results.  The assessment, diagnosis, and plan were formulated together and I agree with the documentation in the resident's note.  Williams, Julie Anne, MD  

## 2022-12-30 ENCOUNTER — Ambulatory Visit (INDEPENDENT_AMBULATORY_CARE_PROVIDER_SITE_OTHER): Payer: Managed Care, Other (non HMO) | Admitting: Student

## 2022-12-30 VITALS — BP 130/76 | HR 71 | Temp 97.9°F | Ht 65.0 in | Wt 173.0 lb

## 2022-12-30 DIAGNOSIS — K219 Gastro-esophageal reflux disease without esophagitis: Secondary | ICD-10-CM | POA: Diagnosis not present

## 2022-12-30 DIAGNOSIS — R1013 Epigastric pain: Secondary | ICD-10-CM

## 2022-12-30 MED ORDER — PANTOPRAZOLE SODIUM 40 MG PO TBEC: 40.0000 mg | DELAYED_RELEASE_TABLET | Freq: Every day | ORAL | 1 refills | Status: DC

## 2022-12-30 NOTE — Patient Instructions (Signed)
Thank you, Ms.H Lavone Schnitzer for allowing [place] to provide your care today. Today we discussed your chest pain which is associated with eating and drinking.   I have ordered the following labs for you:   Lab Orders         H. pylori breath test      Tests ordered today:  - none  Referrals ordered today:   Referral Orders  No referral(s) requested today     I have ordered the following medication/changed the following medications:   Stop the following medications: Medications Discontinued During This Encounter  Medication Reason   famotidine (PEPCID) 40 MG tablet Patient has not taken in last 30 days     Start the following medications: Meds ordered this encounter  Medications   pantoprazole (PROTONIX) 40 MG tablet    Sig: Take 1 tablet (40 mg total) by mouth daily.    Dispense:  30 tablet    Refill:  1     Follow up: 1 month    Remember:   - Please eat smaller meals throughout the day, do not eat a meal before bedtime, stay sitting up at least 30 minutes after eating   - Please take pantoprazole 40 mg before breakfast every day   - I will call you with the results of the H Pylori breath test   Should you have any questions or concerns please call the internal medicine clinic at [phone].     Mikel Hardgrove Colbert Coyer, MD PGY-1 Internal Medicine Teaching Progam [facility]

## 2022-12-30 NOTE — Progress Notes (Signed)
   Established Patient Office Visit  Subjective   Patient ID: Erika Perez, female    DOB: Jul 26, 1965  Age: 57 y.o. MRN: [id number]  Chief Complaint  Patient presents with   Leg Pain    HPI  {History (Optional):23778}  ROS    Objective:     BP 130/76 (BP Location: Right Arm, Patient Position: Sitting, Cuff Size: Small)   Pulse 71   Temp 97.9 F (36.6 C) (Oral)   Ht 5\' 1"  (1.549 m)   Wt 173 lb (78.5 kg)   SpO2 99%   BMI 32.69 kg/m  {Vitals History (Optional):23777}  Physical Exam   No results found for any visits on 12/30/22.  {Labs (Optional):23779}  The ASCVD Risk score (Arnett DK, et al., 2019) failed to calculate for the following reasons:   Cannot find a previous HDL lab   Cannot find a previous total cholesterol lab    Assessment & Plan:   Problem List Items Addressed This Visit   None   No follow-ups on file.    Delynda Sepulveda Colbert Coyer, MD

## 2022-12-31 LAB — H. PYLORI BREATH TEST: H pylori Breath Test: NEGATIVE

## 2022-12-31 NOTE — Progress Notes (Signed)
Internal Medicine Clinic Attending  I was physically present during the key portions of the resident provided service and participated in the medical decision making of patient's management care. I reviewed pertinent patient test results.  The assessment, diagnosis, and plan were formulated together and I agree with the documentation in the resident's note.  Erlinda Hong, MD FACP

## 2022-12-31 NOTE — Assessment & Plan Note (Addendum)
Patient with history of GERD. Has been prescribed famotidine in the past for GERD but does not recall taking it, nor does her husband. Today she presents with abdominal pain with food/after eating and associated chest discomfort consistent with heartburn. Patient reports symptoms have especially worsened in the last week, feels like there is an intermittent pain traveling from her stomach to her neck. Endorses chest discomfort/burning pain when laying down, improves some if she sits up. Describes a bitter taste sometimes. Has not been taking any medications at home, including OTC pepcid or pregabalin which was prescribed for leg pain at her July [facility] visit. She denies any alcohol or tobacco use, but does endorse eating at night before going to bed. Typically eats three meals a day. Denies N/V, diarrhea, constipation, bloody stools, fever, cough, or dysuria. 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   Patient does not appear to take ibuprofen for pain, has not been tested for H Pylori in the past. Denies having a prior EGD to help determine if there is PUD. On physical exam, she endorses epigastric and left upper quadrant pain with palpation. Abdomen soft with no signs of distention, positive bowel sounds present. Suspect symptoms are most likely related to GERD that is not being properly treated. Discussed causes of GERD, PPI therapy, and importance of eating smaller meals throughout the day, sitting up at least 30 minutes after meals, and avoiding ibuprofen/tobacco/alcohol which the patient is already doing. Patient agreed to an H pylori breath test prior to starting PPI therapy. Will follow up on results and treat as needed. Will consider changing PPI at 1 month f/u if no improvement, as well as future EGD if there is no symptom  improvement.  - Completed H Pylori breath test, follow up results - START pantoprazole 40 mg daily - See back in 1 month for symptom follow up

## 2023-02-02 ENCOUNTER — Encounter: Payer: Self-pay | Admitting: Student

## 2023-02-02 ENCOUNTER — Ambulatory Visit: Payer: Commercial Managed Care - HMO | Admitting: Student

## 2023-02-02 VITALS — BP 136/89 | HR 84 | Temp 98.0°F | Ht 66.0 in | Wt 170.2 lb

## 2023-02-02 DIAGNOSIS — R7303 Prediabetes: Secondary | ICD-10-CM | POA: Diagnosis not present

## 2023-02-02 DIAGNOSIS — K219 Gastro-esophageal reflux disease without esophagitis: Secondary | ICD-10-CM | POA: Diagnosis not present

## 2023-02-02 NOTE — Progress Notes (Signed)
CC: GERD symptoms follow-up  HPI:  Erika Perez is a 57 y.o. female living with a history stated below and presents today for GERD symptom follow-up and also discuss her other chronic conditions. Please see problem based assessment and plan for additional details.  Past Medical History:  Diagnosis Date   Chronic low back pain with bilateral sciatica    Chronic neck pain with history of cervical spinal surgery    Dermatographia    GERD (gastroesophageal reflux disease)    Obesity, Class I, BMI 30-34.9    Vitamin D deficiency     Current Outpatient Medications on File Prior to Visit  Medication Sig Dispense Refill   cholecalciferol (VITAMIN D3) 25 MCG (1000 UNIT) tablet Take 1 tablet (1,000 Units total) by mouth daily. 90 tablet 1   pantoprazole (PROTONIX) 40 MG tablet Take 1 tablet (40 mg total) by mouth daily. 30 tablet 1   pregabalin (LYRICA) 25 MG capsule Take 1 capsule (25 mg total) by mouth 2 (two) times daily. 60 capsule 1   No current facility-administered medications on file prior to visit.    No family history on file.  Social History   Socioeconomic History   Marital status: Married    Spouse name: Not on file   Number of children: 2   Years of education: Not on file   Highest education level: Not on file  Occupational History   Not on file  Tobacco Use   Smoking status: Never   Smokeless tobacco: Never  Vaping Use   Vaping status: Never Used  Substance and Sexual Activity   Alcohol use: No   Drug use: No   Sexual activity: Not on file  Other Topics Concern   Not on file  Social History Narrative   Works as a Advertising account planner.   Social Determinants of Health   Financial Resource Strain: Not on file  Food Insecurity: Not on file  Transportation Needs: Not on file  Physical Activity: Not on file  Stress: Not on file  Social Connections: Not on file  Intimate Partner Violence: Not on file    Review of Systems: ROS negative except for what  is noted on the assessment and plan.  Vitals:   02/02/23 1411 02/02/23 1434  BP: (!) 144/76 136/89  Pulse: 79 84  Temp: 98 F (36.7 C)   TempSrc: Oral   SpO2: 98%   Weight: 170 lb 3.2 oz (77.2 kg)   Height: 5\' 6"  (1.676 m)     Physical Exam: Constitutional: Well-appearing woman in no acute distress. [place] native Cardiovascular: regular rate and rhythm, no m/r/g Pulmonary/Chest: Clear lungs bilaterally MSK: normal bulk and tone Neurological: alert & oriented x 3, no focal deficit Skin: warm and dry Psych: Normal mood and affect  Assessment & Plan:   No problem-specific Assessment & Plan notes found for this encounter.  Gastroesophageal reflux Patient presented to the office due to concerns for heartburn and acidic taste in her mouth after she eats that was concerning for GERD.  Patient was initiated on pantoprazole 40 mg daily. H. pylori test is negative.  Patient reports that her symptoms are adequately controlled with pantoprazole 40 mg once daily. Will continue patient on the same regimen for 4 more weeks for total of 8 weeks and reassess her symptoms. If patient's symptoms are not resolved by 8 weeks of PPI treatment, I will consider an upper endoscopy and if normal will follow-up with a 24-hour esophageal pH monitoring. - Continue  pantoprazole 40 mg daily  Prediabetes  History of prediabetes. Last A1c is 5.9. ( 4 months ago)  Patient is agreeable to lifestyle modification and DASH diet at this time for glycemic control. - Follow-up with hemoglobin A1c in 6 months ( May 2025) - Complications of uncontrolled blood glucose adequately discussed with the patient  Patient seen with Dr. Rip Harbour, M.D [facility] Phone: [phone] Date 02/02/2023 Time 2:38 PM

## 2023-02-02 NOTE — Patient Instructions (Signed)
  C?m ?n b H Priem Esterly ? cho php chng ti ch?m Castana cho b?n ngy hm nay. Hm nay chng ta ? th?o lu?n v? s?c kh?e t?ng qut c?ng nh? cc tri?u ch?ng tro ng??c axit c?a b?n.  Chng ti r?t vui khi bi?t r?ng b?n ? kh?e h?n nhi?u k? t? khi b?t ??u s? d?ng l?i thu?c pantoprazole.  Vui lng ti?p t?c dng thu?c ny trong 4 tu?n t?i v g?i l?i cho chng ti vo ngy 30 thng 12 ?? cho chng ti bi?t tnh tr?ng c?a b?n. - C? g?ng khng ?n qu g?n gi? ?i ng?. -V ti?p t?c t?p th? d?c v s? theo di ch? ?? ?n u?ng c?a b?n gi?ng nh? chng ti   [facility] M?NG NGY NGH?!!!     Thank you, Erika Perez for allowing [place] to provide your care today. Today we discussed your general health and also your acid reflux symptoms.  We are glad to hear that you are doing much better since restarting the medication pantoprazole.  Please continue taking this medication for the next 4 weeks and call us back on December 30th to let us know how you are doing. -Try to eat not too close to your bedtime. -And continuing to exercise and will watch your diet just as we   HAPPY HOLIDAYS !!!  I have ordered the following labs for you:  Lab Orders  No laboratory test(s) ordered today     Tests ordered today:   Referrals ordered today:   Referral Orders  No referral(s) requested today     I have ordered the following medication/changed the following medications:   Stop the following medications: There are no discontinued medications.   Start the following medications: No orders of the defined types were placed in this encounter.    Follow up:  1 MONTH    Remember: Please call us back on March 02, 2023 let us know how you are doing  Should you have any questions or concerns please call the internal medicine clinic at [phone].    Kathleen Lime, M.D [facility]

## 2023-02-10 NOTE — Progress Notes (Signed)
Internal Medicine Clinic Attending  I was physically present during the key portions of the resident provided service and participated in the medical decision making of patient's management care. I reviewed pertinent patient test results.  The assessment, diagnosis, and plan were formulated together and I agree with the documentation in the resident's note.  Lau, Grace, MD  

## 2023-03-09 ENCOUNTER — Ambulatory Visit (INDEPENDENT_AMBULATORY_CARE_PROVIDER_SITE_OTHER): Payer: Commercial Managed Care - HMO | Admitting: Student

## 2023-03-09 ENCOUNTER — Other Ambulatory Visit (HOSPITAL_COMMUNITY)
Admission: RE | Admit: 2023-03-09 | Discharge: 2023-03-09 | Disposition: A | Discharge status: Home / Self Care | Payer: Commercial Managed Care - HMO | Source: Ambulatory Visit | Attending: Internal Medicine | Admitting: Internal Medicine

## 2023-03-09 VITALS — BP 134/91 | HR 81 | Temp 98.3°F | Wt 168.9 lb

## 2023-03-09 DIAGNOSIS — E559 Vitamin D deficiency, unspecified: Secondary | ICD-10-CM | POA: Diagnosis not present

## 2023-03-09 DIAGNOSIS — M5442 Lumbago with sciatica, left side: Secondary | ICD-10-CM | POA: Diagnosis not present

## 2023-03-09 DIAGNOSIS — G8929 Other chronic pain: Secondary | ICD-10-CM

## 2023-03-09 DIAGNOSIS — M5441 Lumbago with sciatica, right side: Secondary | ICD-10-CM

## 2023-03-09 DIAGNOSIS — Z124 Encounter for screening for malignant neoplasm of cervix: Secondary | ICD-10-CM | POA: Insufficient documentation

## 2023-03-09 DIAGNOSIS — Z1211 Encounter for screening for malignant neoplasm of colon: Secondary | ICD-10-CM | POA: Insufficient documentation

## 2023-03-09 DIAGNOSIS — K219 Gastro-esophageal reflux disease without esophagitis: Secondary | ICD-10-CM | POA: Diagnosis not present

## 2023-03-09 MED ORDER — PANTOPRAZOLE SODIUM 40 MG PO TBEC: 40.0000 mg | DELAYED_RELEASE_TABLET | Freq: Every day | ORAL | 1 refills | Status: DC

## 2023-03-09 MED ORDER — PREGABALIN 25 MG PO CAPS: 25.0000 mg | ORAL_CAPSULE | Freq: Two times a day (BID) | ORAL | 1 refills | Status: DC

## 2023-03-09 NOTE — Assessment & Plan Note (Signed)
 Pap in 2007 was benign. Pap collected today.  - follow up Pap

## 2023-03-09 NOTE — Assessment & Plan Note (Addendum)
 Patient experiencing lower back pain with unilateral sciatica with improved symptoms on Lyrica  25 mg daily. She is worried that her back pain is related to sitting for her job. Was counseled to stay active and if symptoms persist or have worsened despite the Lyrica , can discuss PT at next visit.  - Refill Lyrica  25 mg - Consider PT referral

## 2023-03-09 NOTE — Progress Notes (Signed)
 The care of the patient was discussed with Dr. Trudy and the assessment and plan was formulated with their assistance.  Please see their note for official documentation of the patient encounter.   Subjective:   Patient ID: Erika Perez female   DOB: 1965-07-29 58 y.o.   MRN: [id number]  HPI: Ms. Erika Perez is a 58 y.o. female presenting for GERD follow up. Accompanied by her husband today. In-person interpreter was present during interview. Overall feeling well but with recurrent reflux symptoms after ran out of PPI and persistent back/nerve pain controlled with Lyrica . Healthcare maintenance included Pap, referral to GI for screening colonoscopy, and assessment of vitamin D  levels given history of deficiency.   Past Medical History:  Diagnosis Date   Chronic low back pain with bilateral sciatica    Chronic neck pain with history of cervical spinal surgery    Dermatographia    GERD (gastroesophageal reflux disease)    Obesity, Class I, BMI 30-34.9    Vitamin D  deficiency    Current Outpatient Medications  Medication Sig Dispense Refill   cholecalciferol (VITAMIN D3) 25 MCG (1000 UNIT) tablet Take 1 tablet (1,000 Units total) by mouth daily. 90 tablet 1   pantoprazole  (PROTONIX ) 40 MG tablet Take 1 tablet (40 mg total) by mouth daily. 90 tablet 1   pregabalin  (LYRICA ) 25 MG capsule Take 1 capsule (25 mg total) by mouth 2 (two) times daily. 180 capsule 1   No current facility-administered medications for this visit.   No family history on file. Social History   Socioeconomic History   Marital status: Married    Spouse name: Not on file   Number of children: 2   Years of education: Not on file   Highest education level: Not on file  Occupational History   Not on file  Tobacco Use   Smoking status: Never   Smokeless tobacco: Never  Vaping Use   Vaping status: Never Used  Substance and Sexual Activity   Alcohol use: No   Drug use: No   Sexual activity: Not on file   Other Topics Concern   Not on file  Social History Narrative   Works as a advertising account planner.   Social Drivers of Corporate Investment Banker Strain: Not on file  Food Insecurity: Not on file  Transportation Needs: Not on file  Physical Activity: Not on file  Stress: Not on file  Social Connections: Not on file   Review of Systems: Pertinent items noted in HPI and remainder of comprehensive ROS otherwise negative.  Objective:  Physical Exam: Vitals:   03/09/23 0830 03/09/23 0841  BP: (!) 136/94 (!) 134/91  Pulse: 77 81  Temp: 98.3 F (36.8 C)   TempSrc: Oral   Weight: 168 lb 14.4 oz (76.6 kg)    BP (!) 134/91 (BP Location: Left Arm, Patient Position: Sitting, Cuff Size: Normal)   Pulse 81   Temp 98.3 F (36.8 C) (Oral)   Wt 168 lb 14.4 oz (76.6 kg)   BMI 27.26 kg/m   General Appearance:    Alert, cooperative, no distress  Head:    Normocephalic, atraumatic  Neck:   Supple, symmetrical, trachea midline, no adenopathy  Lungs:     Clear to auscultation bilaterally, normal work of breathing on room air  Heart:    Regular rate and rhythm, S1 and S2 normal, no murmur, rubs or gallop  Skin:   Skin color, texture, turgor normal, no rashes or lesions  Neurologic:  Grossly normal strength and sensation, no focal deficits   Assessment & Plan:  GERD (gastroesophageal reflux disease) Here today for follow up of GERD. Was prescribed Protonix  40 mg daily which had improved symptoms of chest/abdominal discomfort and feeling short of breath. She reports that she ran out around mid-December and that she had recurrent symptoms over this past weekend. She tested negative on H. Pylori breath test on 10/29. With improvement on PPI and recurrence upon cessation, will restart Protonix . If symptoms worsen or do not improve on PPI, could consider EGD for further assessment.  - Refill pantoprazole  40 mg  Vitamin D  deficiency Last level was 12.6 in 12/2021 with supplementation at that time. She  reports that she took OTC supplements at 1000 IU daily for 2 months and then stopped taking them. Is amenable to rechecking level today and supplementing as needed.  - Supplementation pending vit D level  Chronic low back pain with bilateral sciatica Patient experiencing lower back pain with unilateral sciatica with improved symptoms on Lyrica  25 mg daily. She is worried that her back pain is related to sitting for her job. Was counseled to stay active and if symptoms persist or have worsened despite the Lyrica , can discuss PT at next visit.  - Refill Lyrica  25 mg - Consider PT referral  Encounter for screening for cervical cancer Pap in 2007 was benign. Pap collected today.  - follow up Pap  Colon cancer screening No documented colonoscopy. Referral to GI sent.   Therisa Cramp, MS3

## 2023-03-09 NOTE — Patient Instructions (Addendum)
 C?m ?n b H Priem Uzelac ? cho php chng ti ch?m [place] cho b?n ngy hm nay. Hm nay chng ta ? th?o lu?n:  Tro ng??c: Vui lng kh?i ??ng l?i Protonix  40 mg m?i ngy ?? ki?m sot t?t tri?u ch?ng. Vui lng cho chng ti bi?t m?t vi ngy tr??c khi b?n c?n n?p ti?n.  ?au l?ng: Chng ti ? b? sung l?i ??n thu?c Lyrica  25 mg hai l?n m?t ngy ?? ?i?u tr? ch?ng ?au l?ng/dy th?n kinh c?a b?n.  Thi?u vitamin D : Xt nghi?m mu s? xc ??nh xem b?n c c?n b? sung l?i hay khng. Chng ti s? g?i cho b?n n?u b?n c?n lm nh? v?y.   Khm sng l?c ung th?: Chng ti s? cho b?n bi?t n?u Pap cho th?y ?i?u g ?ng lo ng?i. Chng ti c?ng ? g?i gi?y gi?i thi?u ??n GI ?? n?i soi v h? s? g?i cho b?n ?? ??t l?ch. N?u b?n khng nh?n ???c ph?n h?i t? h? sau m?t tu?n, hy cho chng ti bi?t v chng ti c th? cung c?p cho b?n s? ?i?n tho?i ?? g?i.  Ti ? ??t hng cc phng th nghi?m sau ?y cho b?n:  ??n ??t hng phng th nghi?m         Vitamin D  (25 hydroxy)      Cc gi?i thi?u ???c ??t hng ngy hm nay:   L?nh gi?i thi?u         Chuy?n tuy?n c?p c?u ??n [place]  Theo di: 2-3 thng ?? ki?m tra tri?u ch?ng    Chng ti mong ???c g?p b?n l?n sau. Vui lng g?i cho phng khm c?a chng ti theo s? [phone] n?u b?n c b?t k? cu h?i ho?c th?c m?c no. Th?i gian t?t nh?t ?? g?i l Th? Hai-Th? Su, t? 9 gi? sng - 4 gi? chi?u, nh?ng lun c ng??i tr?c 24/7. N?u sau gi? lm vi?c ho?c cu?i tu?n, hy g?i ??n s? ?i?n tho?i chnh c?a b?nh vi?n v yu c?u Bc s? N?i tr Tr?c tr?c. N?u b?n c?n n?p thm thu?c, vui lng thng bo cho nh thu?c c?a b?n tr??c m?t tu?n v h? s? g?i yu c?u cho chng ti.   C?m ?n b?n ? tin t??ng ch?m [place] ti. Chc b?n nh?ng ?i?u t?t ??p nh?t!  Therisa Cramp, MS3    Thank you, Ms. Traeh Milroy for allowing us  to provide your care today. Today we discussed:  Reflux: Please restart the Protonix  40 mg every day for good symptom control. Please let us  know a few days before you  need a refill.  Back pain: We have refilled your prescription of the Lyrica  25 mg twice a day for your back/nerve pain.   Vitamin D  deficiency: The blood test will identify if you need to take the supplements again. We will call you if you need to do so.   Cancer screenings: We will let you know if the Pap shows anything concerning. We have also sent the referral to GI for the colonoscopy and they should call you to schedule that. If you do not hear from them in a week, let us  know and we can give you the number to call.   I have ordered the following labs for you:  Lab Orders         Vitamin D  (25 hydroxy)      Referrals ordered today:   Referral Orders  Ambulatory referral to Gastroenterology     I have ordered the following medication/changed the following medications:   Stop the following medications: Medications Discontinued During This Encounter  Medication Reason   pantoprazole  (PROTONIX ) 40 MG tablet Reorder     Start the following medications: Meds ordered this encounter  Medications   pantoprazole  (PROTONIX ) 40 MG tablet    Sig: Take 1 tablet (40 mg total) by mouth daily.    Dispense:  90 tablet    Refill:  1     Follow up: 2-3 months for symptom check    We look forward to seeing you next time. Please call our clinic at [phone] if you have any questions or concerns. The best time to call is Monday-Friday from 9am-4pm, but there is someone available 24/7. If after hours or the weekend, call the main hospital number and ask for the Internal Medicine Resident On-Call. If you need medication refills, please notify your pharmacy one week in advance and they will send us  a request.   Thank you for trusting me with your care. Wishing you the best!   Therisa Cramp, MS3

## 2023-03-09 NOTE — Assessment & Plan Note (Addendum)
 Here today for follow up of GERD. Was prescribed Protonix  40 mg daily which had improved symptoms of chest/abdominal discomfort and feeling short of breath. She reports that she ran out around mid-December and that she had recurrent symptoms over this past weekend. She tested negative on H. Pylori breath test on 10/29. With improvement on PPI and recurrence upon cessation, will restart Protonix . If symptoms worsen or do not improve on PPI, could consider EGD for further assessment.  - Refill pantoprazole  40 mg

## 2023-03-09 NOTE — Assessment & Plan Note (Addendum)
 Last level was 12.6 in 12/2021 with supplementation at that time. She reports that she took OTC supplements at 1000 IU daily for 2 months and then stopped taking them. Is amenable to rechecking level today and supplementing as needed.  - Supplementation pending vit D level

## 2023-03-09 NOTE — Assessment & Plan Note (Addendum)
 No documented colonoscopy. Referral to GI sent.

## 2023-03-10 LAB — VITAMIN D 25 HYDROXY (VIT D DEFICIENCY, FRACTURES): Vit D, 25-Hydroxy: 20.2 ng/mL — ABNORMAL LOW (ref 30.0–100.0)

## 2023-03-10 MED ORDER — VITAMIN D3 25 MCG (1000 UNIT) PO TABS: 1000.0000 [IU] | ORAL_TABLET | Freq: Every day | ORAL | 3 refills | Status: AC

## 2023-03-10 NOTE — Addendum Note (Signed)
 Addended by: Rana Snare on: 03/10/2023 09:05 AM   Modules accepted: Orders

## 2023-03-12 LAB — CYTOLOGY - PAP
Comment: NEGATIVE
Diagnosis: NEGATIVE
High risk HPV: NEGATIVE

## 2023-03-12 NOTE — Progress Notes (Signed)
 Internal Medicine Clinic Attending  Case discussed with the student and resident at the time of the visit.  We reviewed the student's history and exam and pertinent patient test results.  I agree with the assessment, diagnosis, and plan of care documented in the student's note attested by the supervising resident.

## 2023-04-16 ENCOUNTER — Ambulatory Visit (INDEPENDENT_AMBULATORY_CARE_PROVIDER_SITE_OTHER): Payer: Commercial Managed Care - HMO | Admitting: Student

## 2023-04-16 ENCOUNTER — Encounter: Payer: Self-pay | Admitting: Student

## 2023-04-16 VITALS — BP 147/94 | HR 76 | Temp 98.3°F | Ht 66.0 in | Wt 169.4 lb

## 2023-04-16 DIAGNOSIS — J069 Acute upper respiratory infection, unspecified: Secondary | ICD-10-CM | POA: Insufficient documentation

## 2023-04-16 MED ORDER — BENZONATATE 100 MG PO CAPS
100.0000 mg | ORAL_CAPSULE | Freq: Three times a day (TID) | ORAL | 0 refills | Status: AC | PRN
Start: 2023-04-16 — End: 2023-04-23

## 2023-04-16 MED ORDER — GUAIFENESIN ER 600 MG PO TB12: 600.0000 mg | ORAL_TABLET | Freq: Two times a day (BID) | ORAL | 0 refills | Status: AC

## 2023-04-16 NOTE — Assessment & Plan Note (Signed)
She presents with 2 weeks of cough, nasal congestion, and chest congestion.  She denies any fevers, nausea, vomiting, or diarrhea.  She was worried about increasing chest congestion, especially at night which is causing her to snore.  Her vitals are stable.  She is afebrile, satting well on room air.  Lungs were clear to auscultation.  I think this is most likely a viral upper respiratory illness and expect her to improve in the next week or 2 with symptomatic treatment.  I have prescribed her Tessalon Perles for cough and Mucinex for sputum clearance.

## 2023-04-16 NOTE — Patient Instructions (Signed)
Thank you, Erika Perez for allowing [place] to provide your care today. Today we discussed your cough and congestion.    As we discussed, this most likely a viral upper respiratory illness.  I will prescribe you Mucinex, which you can take twice daily as needed to try to help clear out some of the phlegm and mucus in your airway.  I will also prescribe Tessalon Perles, which is a cough suppressant and you may take up to 3 times a day.  I expect your symptoms to get better within the next week or 2.  You may take Tylenol or ibuprofen if you have any fevers or headaches.  For your COVID shot, we unfortunately do not administer them here, so you will have to go to a pharmacy to receive them.  I have ordered the following medication/changed the following medications:   Start the following medications: Meds ordered this encounter  Medications   guaiFENesin (MUCINEX) 600 MG 12 hr tablet    Sig: Take 1 tablet (600 mg total) by mouth 2 (two) times daily for 7 days.    Dispense:  14 tablet    Refill:  0   benzonatate (TESSALON PERLES) 100 MG capsule    Sig: Take 1 capsule (100 mg total) by mouth 3 (three) times daily as needed for up to 7 days for cough.    Dispense:  20 capsule    Refill:  0     Follow up: 3 months   We look forward to seeing you next time. Please call our clinic at [phone] if you have any questions or concerns. The best time to call is Monday-Friday from 9am-4pm, but there is someone available 24/7. If after hours or the weekend, call the main hospital number and ask for the Internal Medicine Resident On-Call. If you need medication refills, please notify your pharmacy one week in advance and they will send [place] a request.   Thank you for trusting me with your care. Wishing you the best!   Annett Fabian, MD [facility]

## 2023-04-16 NOTE — Progress Notes (Signed)
CC: Coughing for 2 weeks with yellow mucus  HPI:  Erika Perez is a 58 y.o. female living with a history stated below and presents today for a wet cough. Please see problem based assessment and plan for additional details.  Past Medical History:  Diagnosis Date   Chronic low back pain with bilateral sciatica    Chronic neck pain with history of cervical spinal surgery    Dermatographia    GERD (gastroesophageal reflux disease)    Obesity, Class I, BMI 30-34.9    Vitamin D deficiency     Current Outpatient Medications on File Prior to Visit  Medication Sig Dispense Refill   cholecalciferol (VITAMIN D3) 25 MCG (1000 UNIT) tablet Take 1 tablet (1,000 Units total) by mouth daily. 90 tablet 3   pantoprazole (PROTONIX) 40 MG tablet Take 1 tablet (40 mg total) by mouth daily. 90 tablet 1   pregabalin (LYRICA) 25 MG capsule Take 1 capsule (25 mg total) by mouth 2 (two) times daily. 180 capsule 1   No current facility-administered medications on file prior to visit.   History reviewed. No pertinent family history.  Social History   Socioeconomic History   Marital status: Married    Spouse name: Not on file   Number of children: 2   Years of education: Not on file   Highest education level: Not on file  Occupational History   Not on file  Tobacco Use   Smoking status: Never   Smokeless tobacco: Never  Vaping Use   Vaping status: Never Used  Substance and Sexual Activity   Alcohol use: No   Drug use: No   Sexual activity: Not on file  Other Topics Concern   Not on file  Social History Narrative   Works as a Advertising account planner.   Social Drivers of Corporate investment banker Strain: Low Risk  (04/16/2023)   Overall Financial Resource Strain (CARDIA)    Difficulty of Paying Living Expenses: Not hard at all  Food Insecurity: No Food Insecurity (04/16/2023)   Hunger Vital Sign    Worried About Running Out of Food in the Last Year: Never true    Ran Out of Food in the  Last Year: Never true  Transportation Needs: No Transportation Needs (04/16/2023)   PRAPARE - Administrator, Civil Service (Medical): No    Lack of Transportation (Non-Medical): No  Physical Activity: Not on file  Stress: No Stress Concern Present (04/16/2023)   [employer] of Occupational Health - Occupational Stress Questionnaire    Feeling of Stress : Not at all  Social Connections: Moderately Integrated (04/16/2023)   Social Connection and Isolation Panel [NHANES]    Frequency of Communication with Friends and Family: More than three times a week    Frequency of Social Gatherings with Friends and Family: More than three times a week    Attends Religious Services: 1 to 4 times per year    Active Member of [employer] or Organizations: Not on file    Attends Banker Meetings: Never    Marital Status: Married  Catering manager Violence: Not At Risk (04/16/2023)   Humiliation, Afraid, Rape, and Kick questionnaire    Fear of Current or Ex-Partner: No    Emotionally Abused: No    Physically Abused: No    Sexually Abused: No   Review of Systems: ROS negative except for what is noted on the assessment and plan.  Vitals:   04/16/23 1011 04/16/23 1047  BP: (!) 148/87 (!) 147/94  Pulse: 77 76  Temp: 98.3 F (36.8 C)   TempSrc: Oral   SpO2: 97%   Weight: 169 lb 6.4 oz (76.8 kg)   Height: 5\' 6"  (1.676 m)    Physical Exam: Well-appearing woman in no acute distress.  Husband is present and assisted with translation. Heart rate and rhythm regular, no murmurs Clear to auscultation bilaterally, occasional wet cough, normal respiratory rate and effort Abdomen soft, nontender, nondistended Normal mental status, no focal neurological deficits  Assessment & Plan:   Patient discussed with Dr. Antony Contras  Viral upper respiratory infection She presents with 2 weeks of cough, nasal congestion, and chest congestion.  She denies any fevers, nausea, vomiting, or diarrhea.   She was worried about increasing chest congestion, especially at night which is causing her to snore.  Her vitals are stable.  She is afebrile, satting well on room air.  Lungs were clear to auscultation.  I think this is most likely a viral upper respiratory illness and expect her to improve in the next week or 2 with symptomatic treatment.  I have prescribed her Tessalon Perles for cough and Mucinex for sputum clearance.  Annett Fabian, MD [facility], PGY-1 Phone: [phone] Date 04/16/2023 Time 1:21 PM

## 2023-04-16 NOTE — Addendum Note (Signed)
Addended by: Annett Fabian on: 04/16/2023 05:10 PM   Modules accepted: Level of Service

## 2023-04-17 NOTE — Progress Notes (Signed)
Internal Medicine Clinic Attending  Case discussed with the resident at the time of the visit.  We reviewed the resident's history and exam and pertinent patient test results.  I agree with the assessment, diagnosis, and plan of care documented in the resident's note.

## 2023-06-17 ENCOUNTER — Other Ambulatory Visit: Payer: Self-pay

## 2023-06-17 ENCOUNTER — Encounter: Payer: Self-pay | Admitting: Student

## 2023-06-17 ENCOUNTER — Ambulatory Visit: Payer: Self-pay | Admitting: Student

## 2023-06-17 VITALS — BP 137/72 | HR 74 | Temp 98.4°F | Ht 63.0 in | Wt 166.3 lb

## 2023-06-17 DIAGNOSIS — M5441 Lumbago with sciatica, right side: Secondary | ICD-10-CM

## 2023-06-17 DIAGNOSIS — Z1231 Encounter for screening mammogram for malignant neoplasm of breast: Secondary | ICD-10-CM

## 2023-06-17 DIAGNOSIS — M5442 Lumbago with sciatica, left side: Secondary | ICD-10-CM | POA: Diagnosis not present

## 2023-06-17 DIAGNOSIS — R7303 Prediabetes: Secondary | ICD-10-CM

## 2023-06-17 DIAGNOSIS — E559 Vitamin D deficiency, unspecified: Secondary | ICD-10-CM | POA: Diagnosis not present

## 2023-06-17 DIAGNOSIS — G8929 Other chronic pain: Secondary | ICD-10-CM | POA: Diagnosis not present

## 2023-06-17 DIAGNOSIS — Z0000 Encounter for general adult medical examination without abnormal findings: Secondary | ICD-10-CM

## 2023-06-17 DIAGNOSIS — Z1211 Encounter for screening for malignant neoplasm of colon: Secondary | ICD-10-CM

## 2023-06-17 NOTE — Patient Instructions (Addendum)
 Thank you, Ms.H Priem Gorr for allowing us  to provide your care today. Today we discussed   -Continue Protonix (pantoprazole), Lyrica (pregabalin) and vitamin D3 -Lyrica you can take 2 times a day  -Tylenol 500 or 1000 mg every 6-8 hours as needed for pain  -Referral sent for mammogram and colonoscopy to check for breast and colon cancer  Follow up: 4-6 months   Should you have any questions or concerns please call the internal medicine clinic at [phone].    Romell Cavanah, D.O. [facility]

## 2023-06-17 NOTE — Assessment & Plan Note (Signed)
-  Agreed to complete screening mammogram (no hx of breast cancer, no breast related symptoms at this time) -Agreed to complete screening colonoscopy, referral to GI sent  -Deferred vaccinations and blood work today

## 2023-06-17 NOTE — Progress Notes (Signed)
 CC: routine visit, chronic back pain  HPI:  Erika Perez is a 58 y.o. female living with a history stated below and presents today for routine visit and chronic back pain. Please see problem based assessment and plan for additional details.  Past Medical History:  Diagnosis Date   Chronic low back pain with bilateral sciatica    Chronic neck pain with history of cervical spinal surgery    Dermatographia    GERD (gastroesophageal reflux disease)    Obesity, Class I, BMI 30-34.9    Vitamin D deficiency     Current Outpatient Medications on File Prior to Visit  Medication Sig Dispense Refill   cholecalciferol (VITAMIN D3) 25 MCG (1000 UNIT) tablet Take 1 tablet (1,000 Units total) by mouth daily. 90 tablet 3   pantoprazole (PROTONIX) 40 MG tablet Take 1 tablet (40 mg total) by mouth daily. 90 tablet 1   pregabalin (LYRICA) 25 MG capsule Take 1 capsule (25 mg total) by mouth 2 (two) times daily. 180 capsule 1   No current facility-administered medications on file prior to visit.    History reviewed. No pertinent family history.  Social History   Socioeconomic History   Marital status: Married    Spouse name: Not on file   Number of children: 2   Years of education: Not on file   Highest education level: Not on file  Occupational History   Not on file  Tobacco Use   Smoking status: Never   Smokeless tobacco: Never  Vaping Use   Vaping status: Never Used  Substance and Sexual Activity   Alcohol use: No   Drug use: No   Sexual activity: Not on file  Other Topics Concern   Not on file  Social History Narrative   Works as a Advertising account planner.   Social Drivers of Corporate investment banker Strain: Low Risk  (04/16/2023)   Overall Financial Resource Strain (CARDIA)    Difficulty of Paying Living Expenses: Not hard at all  Food Insecurity: No Food Insecurity (04/16/2023)   Hunger Vital Sign    Worried About Running Out of Food in the Last Year: Never true    Ran  Out of Food in the Last Year: Never true  Transportation Needs: No Transportation Needs (04/16/2023)   PRAPARE - Administrator, Civil Service (Medical): No    Lack of Transportation (Non-Medical): No  Physical Activity: Not on file  Stress: No Stress Concern Present (04/16/2023)   [employer] of Occupational Health - Occupational Stress Questionnaire    Feeling of Stress : Not at all  Social Connections: Moderately Integrated (04/16/2023)   Social Connection and Isolation Panel [NHANES]    Frequency of Communication with Friends and Family: More than three times a week    Frequency of Social Gatherings with Friends and Family: More than three times a week    Attends Religious Services: 1 to 4 times per year    Active Member of [employer] or Organizations: Not on file    Attends Banker Meetings: Never    Marital Status: Married  Catering manager Violence: Not At Risk (04/16/2023)   Humiliation, Afraid, Rape, and Kick questionnaire    Fear of Current or Ex-Partner: No    Emotionally Abused: No    Physically Abused: No    Sexually Abused: No    Review of Systems: ROS negative except for what is noted on the assessment and plan.  Vitals:   06/17/23  1440  BP: 137/72  Pulse: 74  Temp: 98.4 F (36.9 C)  TempSrc: Oral  SpO2: 100%  Weight: 166 lb 4.8 oz (75.4 kg)  Height: 5\' 3"  (1.6 m)   Physical Exam: Constitutional: well-appearing female sitting in chair, in no acute distress Cardiovascular: regular rate and rhythm Pulmonary/Chest: normal work of breathing on room air Neurological: alert & oriented x 3   Assessment & Plan:   Chronic low back pain with bilateral sciatica Chronic. Pain worse at times. Taking Lyrica 25 mg BID with relief. Has tried tiger balm with mild relief, no OTC meds. Able to ambulate and continue working. Denies any red flag symptoms. Discussed adding Tylenol PRN.   Plan -Continue Lyrica 25 mg BID -Tylenol PRN along with heating  pad or topical OTC  Vitamin D deficiency Taking vitamin D3 most days of the week. Encourage adherence. Can consider rechecking level at future OV.   Prediabetes Last A1c 5.9 on 09/2022. Recommend continue with lifestyle modifications. Repeat A1c annually (approx 09/2023).   Healthcare maintenance -Agreed to complete screening mammogram (no hx of breast cancer, no breast related symptoms at this time) -Agreed to complete screening colonoscopy, referral to GI sent  -Deferred vaccinations and blood work today    Patient discussed with Dr. Machen  Erika Perez, D.O. [facility], PGY-2 Phone: [phone] Date 06/17/2023 Time 3:20 PM

## 2023-06-17 NOTE — Assessment & Plan Note (Signed)
 Taking vitamin D3 most days of the week. Encourage adherence. Can consider rechecking level at future OV.

## 2023-06-17 NOTE — Assessment & Plan Note (Signed)
 Chronic. Pain worse at times. Taking Lyrica 25 mg BID with relief. Has tried tiger balm with mild relief, no OTC meds. Able to ambulate and continue working. Denies any red flag symptoms. Discussed adding Tylenol PRN.   Plan -Continue Lyrica 25 mg BID -Tylenol PRN along with heating pad or topical OTC

## 2023-06-17 NOTE — Assessment & Plan Note (Signed)
 Last A1c 5.9 on 09/2022. Recommend continue with lifestyle modifications. Repeat A1c annually (approx 09/2023).

## 2023-06-18 NOTE — Progress Notes (Signed)
 Internal Medicine Clinic Attending  Case discussed with the resident at the time of the visit.  We reviewed the resident's history and exam and pertinent patient test results.  I agree with the assessment, diagnosis, and plan of care documented in the resident's note.

## 2023-07-03 ENCOUNTER — Encounter: Payer: Self-pay | Admitting: Internal Medicine

## 2023-07-17 ENCOUNTER — Ambulatory Visit (AMBULATORY_SURGERY_CENTER): Admitting: *Deleted

## 2023-07-17 VITALS — Ht 63.0 in | Wt 163.0 lb

## 2023-07-17 DIAGNOSIS — Z1211 Encounter for screening for malignant neoplasm of colon: Secondary | ICD-10-CM

## 2023-07-17 MED ORDER — NA SULFATE-K SULFATE-MG SULF 17.5-3.13-1.6 GM/177ML PO SOLN: 1.0000 | Freq: Once | ORAL | 0 refills | Status: AC

## 2023-07-17 NOTE — Progress Notes (Addendum)
 Pt's name and DOB verified at the beginning of the pre-visit wit 2 identifiers  Permission given to speak with Husband  Pt denies any difficulty with ambulating,sitting, laying down or rolling side to side  Pt has no issues moving head neck or swallowing  No egg or soy allergy known to patient   No issues known to pt with past sedation with any surgeries or procedures  Patient denies ever being intubated  No FH of Malignant Hyperthermia  Pt is not on home 02   Pt is not on blood thinners   Pt denies issues with constipation   Pt is not on dialysis  Pt denise any abnormal heart rhythms   Pt denies any upcoming cardiac testing  Chart not reviewed by CRNA prior to [facility]  Visit in person in person with interpretor for  Montagnard Rhade   Pt states weight is   Pt Scale weight is  IInstructions reviewed. Pt given  both [facility] main # and MD on call # prior to instructions.  Pt states understanding of instructions. Instructed pt to review instructions again prior to procedure and call main # given if has questions.. Pt states they will.   Instructed pt on where to find instructions on My Chart.

## 2023-07-21 ENCOUNTER — Encounter: Payer: Self-pay | Admitting: Internal Medicine

## 2023-08-03 ENCOUNTER — Encounter: Payer: Self-pay | Admitting: Internal Medicine

## 2023-08-03 ENCOUNTER — Ambulatory Visit (AMBULATORY_SURGERY_CENTER): Admitting: Internal Medicine

## 2023-08-03 VITALS — BP 144/78 | HR 70 | Temp 97.5°F | Resp 21 | Ht 63.0 in | Wt 163.0 lb

## 2023-08-03 DIAGNOSIS — Z1211 Encounter for screening for malignant neoplasm of colon: Secondary | ICD-10-CM | POA: Diagnosis present

## 2023-08-03 DIAGNOSIS — K648 Other hemorrhoids: Secondary | ICD-10-CM

## 2023-08-03 DIAGNOSIS — D120 Benign neoplasm of cecum: Secondary | ICD-10-CM

## 2023-08-03 MED ORDER — SODIUM CHLORIDE 0.9 % IV SOLN: 500.0000 mL | Freq: Once | INTRAVENOUS | Status: DC

## 2023-08-03 NOTE — Progress Notes (Signed)
 Husband will interpret- release of responsibility for interpretation form filled out Pt's states no medical or surgical changes since previsit or office visit.

## 2023-08-03 NOTE — Progress Notes (Signed)
 GASTROENTEROLOGY PROCEDURE Erika&P NOTE   Primary Care Physician: Jearldine Mina, DO    Reason for Procedure:   Colon cancer screening  Plan:    Colonoscopy  Patient is appropriate for endoscopic procedure(s) in the ambulatory ([facility]) setting.  The nature of the procedure, as well as the risks, benefits, and alternatives were carefully and thoroughly reviewed with the patient. Ample time for discussion and questions allowed. The patient understood, was satisfied, and agreed to proceed.     HPI: Erika Perez is a 58 y.o. female who presents for colonoscopy for colon cancer screening. Denies blood in stools, changes in bowel habits, or unintentional weight loss. Denies family history of colon cancer.  Past Medical History:  Diagnosis Date   Chronic low back pain with bilateral sciatica    Chronic neck pain with history of cervical spinal surgery    Dermatographia    Dermatographia    GERD (gastroesophageal reflux disease)    Obesity, Class I, BMI 30-34.9    Sleep apnea    Vitamin D  deficiency     Past Surgical History:  Procedure Laterality Date   CESAREAN SECTION     SPINE SURGERY      Prior to Admission medications   Medication Sig Start Date End Date Taking? Authorizing Provider  cholecalciferol (VITAMIN D3) 25 MCG (1000 UNIT) tablet Take 1 tablet (1,000 Units total) by mouth daily. 03/10/23   Zheng, Michael, DO  pantoprazole  (PROTONIX ) 40 MG tablet Take 1 tablet (40 mg total) by mouth daily. 03/09/23   Zheng, Michael, DO  pregabalin  (LYRICA ) 25 MG capsule Take 1 capsule (25 mg total) by mouth 2 (two) times daily. 03/09/23   Zheng, Michael, DO    Current Outpatient Medications  Medication Sig Dispense Refill   cholecalciferol (VITAMIN D3) 25 MCG (1000 UNIT) tablet Take 1 tablet (1,000 Units total) by mouth daily. 90 tablet 3   pantoprazole  (PROTONIX ) 40 MG tablet Take 1 tablet (40 mg total) by mouth daily. 90 tablet 1   pregabalin  (LYRICA ) 25 MG capsule Take 1 capsule (25  mg total) by mouth 2 (two) times daily. 180 capsule 1   Current Facility-Administered Medications  Medication Dose Route Frequency Provider Last Rate Last Admin   0.9 %  sodium chloride infusion  500 mL Intravenous Once Zoa Dowty C, MD        Allergies as of 08/03/2023   (No Known Allergies)    Family History  Problem Relation Age of Onset   Colon cancer Neg Hx    Colon polyps Neg Hx    Esophageal cancer Neg Hx    Stomach cancer Neg Hx    Rectal cancer Neg Hx     Social History   Socioeconomic History   Marital status: Married    Spouse name: Not on file   Number of children: 2   Years of education: Not on file   Highest education level: Not on file  Occupational History   Not on file  Tobacco Use   Smoking status: Never   Smokeless tobacco: Never  Vaping Use   Vaping status: Never Used  Substance and Sexual Activity   Alcohol use: No   Drug use: No   Sexual activity: Not on file  Other Topics Concern   Not on file  Social History Narrative   Works as a Advertising account planner.   Social Drivers of Health   Financial Resource Strain: Low Risk  (04/16/2023)   Overall Financial Resource Strain (CARDIA)  Difficulty of Paying Living Expenses: Not hard at all  Food Insecurity: No Food Insecurity (04/16/2023)   Hunger Vital Sign    Worried About Running Out of Food in the Last Year: Never true    Ran Out of Food in the Last Year: Never true  Transportation Needs: No Transportation Needs (04/16/2023)   PRAPARE - Administrator, Civil Service (Medical): No    Lack of Transportation (Non-Medical): No  Physical Activity: Not on file  Stress: No Stress Concern Present (04/16/2023)   [employer] of Occupational Health - Occupational Stress Questionnaire    Feeling of Stress : Not at all  Social Connections: Moderately Integrated (04/16/2023)   Social Connection and Isolation Panel [NHANES]    Frequency of Communication with Friends and Family: More than  three times a week    Frequency of Social Gatherings with Friends and Family: More than three times a week    Attends Religious Services: 1 to 4 times per year    Active Member of [employer] or Organizations: Not on file    Attends Banker Meetings: Never    Marital Status: Married  Catering manager Violence: Not At Risk (04/16/2023)   Humiliation, Afraid, Rape, and Kick questionnaire    Fear of Current or Ex-Partner: No    Emotionally Abused: No    Physically Abused: No    Sexually Abused: No    Physical Exam: Vital signs in last 24 hours: BP (!) 148/86   Pulse 74   Temp (!) 97.5 F (36.4 C) (Skin)   Ht 5\' 3"  (1.6 m)   Wt 163 lb (73.9 kg)   SpO2 98%   BMI 28.87 kg/m  GEN: NAD EYE: Sclerae anicteric ENT: MMM CV: Non-tachycardic Pulm: No increased work of breathing GI: Soft, NT/ND NEURO:  Alert & Oriented   Regino Caprio, MD  Gastroenterology  08/03/2023 9:13 AM

## 2023-08-03 NOTE — Progress Notes (Signed)
 Sedate, gd SR, tolerated procedure well, VSS, report to RN

## 2023-08-03 NOTE — Op Note (Signed)
 [facility] Patient Name: Erika Perez Procedure Date: 08/03/2023 9:24 AM MRN: [id number] Endoscopist: Freada Jacobs [place] , , [id number] Age: 58 Referring MD:  Date of Birth: 09/02/1965 Gender: Female Account #: [id number] Procedure:                Colonoscopy Indications:              Screening for colorectal malignant neoplasm, This                            is the patient's first colonoscopy Medicines:                Monitored Anesthesia Care Procedure:                Pre-Anesthesia Assessment:                           - Prior to the procedure, a History and Physical                            was performed, and patient medications and                            allergies were reviewed. The patient's tolerance of                            previous anesthesia was also reviewed. The risks                            and benefits of the procedure and the sedation                            options and risks were discussed with the patient.                            All questions were answered, and informed consent                            was obtained. Prior Anticoagulants: The patient has                            taken no anticoagulant or antiplatelet agents. ASA                            Grade Assessment: II - A patient with mild systemic                            disease. After reviewing the risks and benefits,                            the patient was deemed in satisfactory condition to                            undergo the procedure.  After obtaining informed consent, the colonoscope                            was passed under direct vision. Throughout the                            procedure, the patient's blood pressure, pulse, and                            oxygen saturations were monitored continuously. The                            Olympus Scope SN [id number] was introduced through the                            anus and  advanced to the the terminal ileum. The                            colonoscopy was performed without difficulty. The                            patient tolerated the procedure well. The quality                            of the bowel preparation was excellent. The                            terminal ileum, ileocecal valve, appendiceal                            orifice, and rectum were photographed. Scope In: 9:32:57 AM Scope Out: 9:46:16 AM Scope Withdrawal Time: 0 hours 9 minutes 58 seconds  Total Procedure Duration: 0 hours 13 minutes 19 seconds  Findings:                 The terminal ileum appeared normal.                           A 6 mm polyp was found in the cecum. The polyp was                            sessile. The polyp was removed with a cold snare.                            Resection and retrieval were complete.                           Non-bleeding internal hemorrhoids were found during                            retroflexion. Complications:            No immediate complications. Estimated Blood Loss:     Estimated blood loss was minimal. Impression:               -  The examined portion of the ileum was normal.                           - One 6 mm polyp in the cecum, removed with a cold                            snare. Resected and retrieved.                           - Non-bleeding internal hemorrhoids. Recommendation:           - Discharge patient to home (with escort).                           - Await pathology results.                           - The findings and recommendations were discussed                            with the patient. Dr Pedro Bourgeois "[place]" [place],  08/03/2023 9:48:29 AM

## 2023-08-03 NOTE — Patient Instructions (Signed)
 Resume previous diet Continue present medications Await pathology results Handouts/information given for polyps and hemorrhoids  YOU HAD AN ENDOSCOPIC PROCEDURE TODAY AT THE [facility]:   Refer to the procedure report that was given to you for any specific questions about what was found during the examination.  If the procedure report does not answer your questions, please call your gastroenterologist to clarify.  If you requested that your care partner not be given the details of your procedure findings, then the procedure report has been included in a sealed envelope for you to review at your convenience later.  YOU SHOULD EXPECT: Some feelings of bloating in the abdomen. Passage of more gas than usual.  Walking can help get rid of the air that was put into your GI tract during the procedure and reduce the bloating. If you had a lower endoscopy (such as a colonoscopy or flexible sigmoidoscopy) you may notice spotting of blood in your stool or on the toilet paper. If you underwent a bowel prep for your procedure, you may not have a normal bowel movement for a few days.  Please Note:  You might notice some irritation and congestion in your nose or some drainage.  This is from the oxygen used during your procedure.  There is no need for concern and it should clear up in a day or so.  SYMPTOMS TO REPORT IMMEDIATELY:  Following lower endoscopy (colonoscopy):  Excessive amounts of blood in the stool  Significant tenderness or worsening of abdominal pains  Swelling of the abdomen that is new, acute  Fever of 100F or higher  For urgent or emergent issues, a gastroenterologist can be reached at any hour by calling [phone]. Do not use MyChart messaging for urgent concerns.   DIET:  We do recommend a small meal at first, but then you may proceed to your regular diet.  Drink plenty of fluids but you should avoid alcoholic beverages for 24 hours.  ACTIVITY:  You should plan to take  it easy for the rest of today and you should NOT DRIVE or use heavy machinery until tomorrow (because of the sedation medicines used during the test).    FOLLOW UP: Our staff will call the number listed on your records the next business day following your procedure.  We will call around 7:15- 8:00 am to check on you and address any questions or concerns that you may have regarding the information given to you following your procedure. If we do not reach you, we will leave a message.     If any biopsies were taken you will be contacted by phone or by letter within the next 1-3 weeks.  Please call us at [phone] if you have not heard about the biopsies in 3 weeks.    SIGNATURES/CONFIDENTIALITY: You and/or your care partner have signed paperwork which will be entered into your electronic medical record.  These signatures attest to the fact that that the information above on your After Visit Summary has been reviewed and is understood.  Full responsibility of the confidentiality of this discharge information lies with you and/or your care-partner.

## 2023-08-04 ENCOUNTER — Telehealth: Payer: Self-pay

## 2023-08-04 NOTE — Telephone Encounter (Signed)
  Follow up Call-     08/03/2023    9:12 AM  Call back number  Post procedure Call Back phone  # [phone] Husband's phone  Permission to leave phone message Yes     Patient questions:  Do you have a fever, pain , or abdominal swelling? No. Pain Score  0 *  Have you tolerated food without any problems? Yes.    Have you been able to return to your normal activities? Yes.    Do you have any questions about your discharge instructions: Diet   No. Medications  No. Follow up visit  No.  Do you have questions or concerns about your Care? No.  Actions: * If pain score is 4 or above: No action needed, pain <4.  Spoke with pt's spouse, who does speak english.  He states the pt is doing well and not having any issues.

## 2023-08-06 ENCOUNTER — Ambulatory Visit: Payer: Self-pay | Admitting: Internal Medicine

## 2023-08-06 LAB — SURGICAL PATHOLOGY

## 2023-10-13 ENCOUNTER — Ambulatory Visit

## 2023-10-13 VITALS — BP 136/84 | HR 76 | Temp 98.2°F | Ht 63.0 in | Wt 167.2 lb

## 2023-10-13 DIAGNOSIS — G8929 Other chronic pain: Secondary | ICD-10-CM | POA: Diagnosis not present

## 2023-10-13 DIAGNOSIS — M5441 Lumbago with sciatica, right side: Secondary | ICD-10-CM | POA: Diagnosis not present

## 2023-10-13 DIAGNOSIS — R1084 Generalized abdominal pain: Secondary | ICD-10-CM | POA: Diagnosis not present

## 2023-10-13 DIAGNOSIS — M5442 Lumbago with sciatica, left side: Secondary | ICD-10-CM | POA: Diagnosis not present

## 2023-10-13 MED ORDER — PREGABALIN 25 MG PO CAPS: 25.0000 mg | ORAL_CAPSULE | Freq: Two times a day (BID) | ORAL | 1 refills | Status: AC

## 2023-10-13 MED ORDER — PANTOPRAZOLE SODIUM 40 MG PO TBEC: 40.0000 mg | DELAYED_RELEASE_TABLET | Freq: Every day | ORAL | 1 refills | Status: AC

## 2023-10-13 NOTE — Assessment & Plan Note (Addendum)
 Patient's new onset abdominal pain does not seem emergent in nature as of now due to no red flags like fevers, chills, bloody stools.  However, we wanted to get a workup to look for any abnormalities in her liver, pancreas and to rule out occult bleeding--will get a cmp, lipase, cbc today. Asked her to start taking pantoprazole  to see if it provides relief.   --start pantoprazole  40 mg daily --Will get cbc, cmp, lipase --follow-up in 1 week

## 2023-10-13 NOTE — Assessment & Plan Note (Signed)
 Pt had back pain today and required support from her husband to sit back up during PE. She did not get a refill for her Lyrica . Will refill her lyrica  today.  --start Lyrica  25 mg BID

## 2023-10-13 NOTE — Patient Instructions (Signed)
 Please follow the instructions as discussed in today's plan: --Start taking pantoprazole  40 mg daily for abdominal pain --Start taking Lyrica  25 mg twice daily for back pain --We got some lab work done today.  Will call you if we see any abnormal results.  You can give us  a callback if you have any questions. --Please see us  in 1 week  Thank you for seeing us  today.  Sincerely, Dr. Edgardo

## 2023-10-13 NOTE — Progress Notes (Signed)
 CC: Abdominal Pain  HPI:  Erika Perez is a 58 y.o. female living with a history stated below and presents today for abdominal pain.  She was accompanied by her husband who helped in translation.  Patient said her pain started about a week ago.  Pain is present throughout her abdomen.  She rated her pain a 4-5 out of 10.  Patient denies any changes in bowel movements, fevers, chills, bloody stools, urinary symptoms, diarrhea, constipation.  She tried taking Tylenol yesterday but that did not seem to help.  Her abdominal pain is more burning in nature but it is different from her previous GERD symptoms as that occurs mostly in her chest.    Patient also complained about low back pain for which she was seen during her previous visit.  She ran out of her Lyrica  and wanted a refill.  Please see problem based assessment and plan for additional details.  Past Medical History:  Diagnosis Date   Chronic low back pain with bilateral sciatica    Chronic neck pain with history of cervical spinal surgery    Dermatographia    Dermatographia    GERD (gastroesophageal reflux disease)    Obesity, Class I, BMI 30-34.9    Sleep apnea    Vitamin D  deficiency     Current Outpatient Medications on File Prior to Visit  Medication Sig Dispense Refill   cholecalciferol (VITAMIN D3) 25 MCG (1000 UNIT) tablet Take 1 tablet (1,000 Units total) by mouth daily. 90 tablet 3   pantoprazole  (PROTONIX ) 40 MG tablet Take 1 tablet (40 mg total) by mouth daily. 90 tablet 1   pregabalin  (LYRICA ) 25 MG capsule Take 1 capsule (25 mg total) by mouth 2 (two) times daily. 180 capsule 1   No current facility-administered medications on file prior to visit.    Family History  Problem Relation Age of Onset   Colon cancer Neg Hx    Colon polyps Neg Hx    Esophageal cancer Neg Hx    Stomach cancer Neg Hx    Rectal cancer Neg Hx     Social History   Socioeconomic History   Marital status: Married    Spouse  name: Not on file   Number of children: 2   Years of education: Not on file   Highest education level: Not on file  Occupational History   Not on file  Tobacco Use   Smoking status: Never   Smokeless tobacco: Never  Vaping Use   Vaping status: Never Used  Substance and Sexual Activity   Alcohol use: No   Drug use: No   Sexual activity: Not on file  Other Topics Concern   Not on file  Social History Narrative   Works as a Advertising account planner.   Social Drivers of Corporate investment banker Strain: Low Risk  (04/16/2023)   Overall Financial Resource Strain (CARDIA)    Difficulty of Paying Living Expenses: Not hard at all  Food Insecurity: No Food Insecurity (04/16/2023)   Hunger Vital Sign    Worried About Running Out of Food in the Last Year: Never true    Ran Out of Food in the Last Year: Never true  Transportation Needs: No Transportation Needs (04/16/2023)   PRAPARE - Administrator, Civil Service (Medical): No    Lack of Transportation (Non-Medical): No  Physical Activity: Not on file  Stress: No Stress Concern Present (04/16/2023)   [employer] of Occupational Health - Occupational  Stress Questionnaire    Feeling of Stress : Not at all  Social Connections: Moderately Integrated (04/16/2023)   Social Connection and Isolation Panel    Frequency of Communication with Friends and Family: More than three times a week    Frequency of Social Gatherings with Friends and Family: More than three times a week    Attends Religious Services: 1 to 4 times per year    Active Member of [employer] or Organizations: Not on file    Attends Banker Meetings: Never    Marital Status: Married  Catering manager Violence: Not At Risk (04/16/2023)   Humiliation, Afraid, Rape, and Kick questionnaire    Fear of Current or Ex-Partner: No    Emotionally Abused: No    Physically Abused: No    Sexually Abused: No    Review of Systems: ROS  All pertinent review of systems  and history, assessment, plan There were no vitals filed for this visit.  Physical Exam: Physical Exam Abdominal:     Comments: Tenderness present in left upper quadrant, left lower quadrant, right lower quadrant.  No rebound, guarding.  Musculoskeletal:     Comments: Tenderness in central lower back. Pt required husband's support to sit up.  Neurological:     Mental Status: She is alert.      Assessment & Plan:     Patient seen with Dr. Karna  Assessment & Plan Generalized abdominal pain Patient's new onset abdominal pain does not seem emergent in nature as of now due to no red flags like fevers, chills, bloody stools.  However, we wanted to get a workup to look for any abnormalities in her liver, pancreas and to rule out occult bleeding--will get a cmp, lipase, cbc today. Asked her to start taking pantoprazole  to see if it provides relief.   --start pantoprazole  40 mg daily --Will get cbc, cmp, lipase --follow-up in 1 week Chronic bilateral low back pain with bilateral sciatica Pt had back pain today and required support from her husband to sit back up during PE. She did not get a refill for her Lyrica . Will refill her lyrica  today.  --start Lyrica  25 mg BID   No orders of the defined types were placed in this encounter.    [name], D.O. [facility] Internal Medicine, PGY-1 Date 10/13/2023 Time 2:02 PM

## 2023-10-13 NOTE — Progress Notes (Deleted)
 CC: Follow-up  HPI:  Erika Perez is a 58 y.o. female living with a history stated below and presents today for follow-up. Please see problem based assessment and plan for additional details.  1 week, stomach hurts, today worse, called husband--not bad but constant   Past Medical History:  Diagnosis Date   Chronic low back pain with bilateral sciatica    Chronic neck pain with history of cervical spinal surgery    Dermatographia    Dermatographia    GERD (gastroesophageal reflux disease)    Obesity, Class I, BMI 30-34.9    Sleep apnea    Vitamin D  deficiency     Current Outpatient Medications on File Prior to Visit  Medication Sig Dispense Refill   cholecalciferol (VITAMIN D3) 25 MCG (1000 UNIT) tablet Take 1 tablet (1,000 Units total) by mouth daily. 90 tablet 3   pantoprazole  (PROTONIX ) 40 MG tablet Take 1 tablet (40 mg total) by mouth daily. 90 tablet 1   pregabalin  (LYRICA ) 25 MG capsule Take 1 capsule (25 mg total) by mouth 2 (two) times daily. 180 capsule 1   No current facility-administered medications on file prior to visit.    Family History  Problem Relation Age of Onset   Colon cancer Neg Hx    Colon polyps Neg Hx    Esophageal cancer Neg Hx    Stomach cancer Neg Hx    Rectal cancer Neg Hx     Social History   Socioeconomic History   Marital status: Married    Spouse name: Not on file   Number of children: 2   Years of education: Not on file   Highest education level: Not on file  Occupational History   Not on file  Tobacco Use   Smoking status: Never   Smokeless tobacco: Never  Vaping Use   Vaping status: Never Used  Substance and Sexual Activity   Alcohol use: No   Drug use: No   Sexual activity: Not on file  Other Topics Concern   Not on file  Social History Narrative   Works as a Advertising account planner.   Social Drivers of Corporate investment banker Strain: Low Risk  (04/16/2023)   Overall Financial Resource Strain (CARDIA)     Difficulty of Paying Living Expenses: Not hard at all  Food Insecurity: No Food Insecurity (04/16/2023)   Hunger Vital Sign    Worried About Running Out of Food in the Last Year: Never true    Ran Out of Food in the Last Year: Never true  Transportation Needs: No Transportation Needs (04/16/2023)   PRAPARE - Administrator, Civil Service (Medical): No    Lack of Transportation (Non-Medical): No  Physical Activity: Not on file  Stress: No Stress Concern Present (04/16/2023)   [employer] of Occupational Health - Occupational Stress Questionnaire    Feeling of Stress : Not at all  Social Connections: Moderately Integrated (04/16/2023)   Social Connection and Isolation Panel    Frequency of Communication with Friends and Family: More than three times a week    Frequency of Social Gatherings with Friends and Family: More than three times a week    Attends Religious Services: 1 to 4 times per year    Active Member of [employer] or Organizations: Not on file    Attends Banker Meetings: Never    Marital Status: Married  Catering manager Violence: Not At Risk (04/16/2023)   Humiliation, Afraid, Rape, and Kick  questionnaire    Fear of Current or Ex-Partner: No    Emotionally Abused: No    Physically Abused: No    Sexually Abused: No    Review of Systems: ROS   Vitals:   10/13/23 1406  BP: 136/84  Pulse: 76  Temp: 98.2 F (36.8 C)  TempSrc: Oral  SpO2: 94%  Weight: 167 lb 3.2 oz (75.8 kg)  Height: 5' 3 (1.6 m)    Physical Exam: Physical Exam   Assessment & Plan:     Patient {[id number]::discussed with,seen with} Dr. {[id number]::Tpoopjfd,Z. Hoffman,Mullen,Narendra,Vincent,Guilloud,Lau,Machen}  Assessment & Plan    No orders of the defined types were placed in this encounter.    Erika Perez, D.O. [facility] Internal Medicine, PGY-1 Date 10/13/2023 Time 2:10 PM

## 2023-10-14 ENCOUNTER — Encounter

## 2023-10-14 LAB — COMPREHENSIVE METABOLIC PANEL WITH GFR
ALT: 12 IU/L (ref 0–32)
AST: 20 IU/L (ref 0–40)
Albumin: 3.9 g/dL (ref 3.8–4.9)
Alkaline Phosphatase: 94 IU/L (ref 44–121)
BUN/Creatinine Ratio: 16 (ref 9–23)
BUN: 12 mg/dL (ref 6–24)
Bilirubin Total: <0.2 mg/dL (ref 0.0–1.2)
CO2: 25 mmol/L (ref 20–29)
Calcium: 8.8 mg/dL (ref 8.7–10.2)
Chloride: 104 mmol/L (ref 96–106)
Creatinine, Ser: 0.73 mg/dL (ref 0.57–1.00)
Globulin, Total: 2.6 g/dL (ref 1.5–4.5)
Glucose: 91 mg/dL (ref 70–99)
Potassium: 3.4 mmol/L — ABNORMAL LOW (ref 3.5–5.2)
Sodium: 144 mmol/L (ref 134–144)
Total Protein: 6.5 g/dL (ref 6.0–8.5)
eGFR: 96 mL/min/1.73 (ref >59)

## 2023-10-14 LAB — CBC
Hematocrit: 38.6 % (ref 34.0–46.6)
Hemoglobin: 12.5 g/dL (ref 11.1–15.9)
MCH: 29.4 pg (ref 26.6–33.0)
MCHC: 32.4 g/dL (ref 31.5–35.7)
MCV: 91 fL (ref 79–97)
Platelets: 302 x10E3/uL (ref 150–450)
RBC: 4.25 x10E6/uL (ref 3.77–5.28)
RDW: 12.8 % (ref 11.7–15.4)
WBC: 10.7 x10E3/uL (ref 3.4–10.8)

## 2023-10-14 LAB — LIPASE: Lipase: 31 U/L (ref 14–72)

## 2023-10-14 NOTE — Progress Notes (Signed)
 Internal Medicine Clinic Attending  I was physically present during the key portions of the resident provided service and participated in the medical decision making of patient's management care. I reviewed pertinent patient test results.  The assessment, diagnosis, and plan were formulated together and I agree with the documentation in the resident's note.  Intermittent burning abdominal pain for one week with no fever, urinary, or additional bowel symptoms. Abdominal exam with mild diffuse tenderness in epigastrium, RUQ, LLQ, and RLQ. CSY in June 2025 without evidence of diverticula. Will resume PPI for history of dyspepsia and GERD. F/u in one week for repeat evaluation and consideration of further w/u if needed.  Karna Fellows, MD

## 2023-10-20 ENCOUNTER — Ambulatory Visit (INDEPENDENT_AMBULATORY_CARE_PROVIDER_SITE_OTHER)

## 2023-10-20 ENCOUNTER — Ambulatory Visit: Payer: Self-pay

## 2023-10-20 VITALS — BP 116/73 | HR 78 | Temp 98.5°F | Ht 63.0 in | Wt 164.2 lb

## 2023-10-20 DIAGNOSIS — M5441 Lumbago with sciatica, right side: Secondary | ICD-10-CM | POA: Diagnosis not present

## 2023-10-20 DIAGNOSIS — R1084 Generalized abdominal pain: Secondary | ICD-10-CM

## 2023-10-20 DIAGNOSIS — M5442 Lumbago with sciatica, left side: Secondary | ICD-10-CM | POA: Diagnosis not present

## 2023-10-20 DIAGNOSIS — Z0000 Encounter for general adult medical examination without abnormal findings: Secondary | ICD-10-CM

## 2023-10-20 DIAGNOSIS — G8929 Other chronic pain: Secondary | ICD-10-CM | POA: Diagnosis not present

## 2023-10-20 NOTE — Assessment & Plan Note (Signed)
 Patient said her abdominal pain is much better.  She rated as 1 out of 10 and said it is very mild and mostly in her left lower quadrant.  She said pantoprazole  helped and she would continue to do that.  Discussed patient's lab results from last week--her lipase, CBC, CMP were all normal. --Continue taking pantoprazole  40 mg daily

## 2023-10-20 NOTE — Progress Notes (Addendum)
 CC: Abdominal pain follow-up  HPI:  Erika Perez is a 58 y.o. female living with a history stated below and presents today for 1 week follow-up of abdominal pain.  Please see problem based assessment and plan for additional details.  Past Medical History:  Diagnosis Date   Chronic low back pain with bilateral sciatica    Chronic neck pain with history of cervical spinal surgery    Dermatographia    Dermatographia    GERD (gastroesophageal reflux disease)    Obesity, Class I, BMI 30-34.9    Sleep apnea    Vitamin D  deficiency     Current Outpatient Medications on File Prior to Visit  Medication Sig Dispense Refill   cholecalciferol (VITAMIN D3) 25 MCG (1000 UNIT) tablet Take 1 tablet (1,000 Units total) by mouth daily. 90 tablet 3   pantoprazole  (PROTONIX ) 40 MG tablet Take 1 tablet (40 mg total) by mouth daily. 90 tablet 1   pregabalin  (LYRICA ) 25 MG capsule Take 1 capsule (25 mg total) by mouth 2 (two) times daily. 180 capsule 1   No current facility-administered medications on file prior to visit.    Family History  Problem Relation Age of Onset   Colon cancer Neg Hx    Colon polyps Neg Hx    Esophageal cancer Neg Hx    Stomach cancer Neg Hx    Rectal cancer Neg Hx     Social History   Socioeconomic History   Marital status: Married    Spouse name: Not on file   Number of children: 2   Years of education: Not on file   Highest education level: Not on file  Occupational History   Not on file  Tobacco Use   Smoking status: Never   Smokeless tobacco: Never  Vaping Use   Vaping status: Never Used  Substance and Sexual Activity   Alcohol use: No   Drug use: No   Sexual activity: Not on file  Other Topics Concern   Not on file  Social History Narrative   Works as a Advertising account planner.   Social Drivers of Corporate investment banker Strain: Low Risk  (04/16/2023)   Overall Financial Resource Strain (CARDIA)    Difficulty of Paying Living Expenses: Not  hard at all  Food Insecurity: No Food Insecurity (04/16/2023)   Hunger Vital Sign    Worried About Running Out of Food in the Last Year: Never true    Ran Out of Food in the Last Year: Never true  Transportation Needs: No Transportation Needs (04/16/2023)   PRAPARE - Administrator, Civil Service (Medical): No    Lack of Transportation (Non-Medical): No  Physical Activity: Not on file  Stress: No Stress Concern Present (04/16/2023)   [employer] of Occupational Health - Occupational Stress Questionnaire    Feeling of Stress : Not at all  Social Connections: Moderately Integrated (04/16/2023)   Social Connection and Isolation Panel    Frequency of Communication with Friends and Family: More than three times a week    Frequency of Social Gatherings with Friends and Family: More than three times a week    Attends Religious Services: 1 to 4 times per year    Active Member of [employer] or Organizations: Not on file    Attends Banker Meetings: Never    Marital Status: Married  Catering manager Violence: Not At Risk (04/16/2023)   Humiliation, Afraid, Rape, and Kick questionnaire    Fear  of Current or Ex-Partner: No    Emotionally Abused: No    Physically Abused: No    Sexually Abused: No    Review of Systems: ROS  All pertinent review of systems in history, assessment, plan There were no vitals filed for this visit.  Physical Exam: Physical Exam Constitutional:      Appearance: Normal appearance.  HENT:     Head: Normocephalic.  Cardiovascular:     Rate and Rhythm: Normal rate and regular rhythm.     Heart sounds: Normal heart sounds.  Pulmonary:     Effort: Pulmonary effort is normal.     Breath sounds: Normal breath sounds. No wheezing.  Abdominal:     Tenderness: There is no guarding or rebound.     Comments: Patient still endorses mild tenderness in the left lower quadrant  Neurological:     Mental Status: She is alert.      Assessment & Plan:      Patient seen with Dr. Shawn  Assessment & Plan Generalized abdominal pain Patient said her abdominal pain is much better.  She rated as 1 out of 10 and said it is very mild and mostly in her left lower quadrant.  She said pantoprazole  helped and she would continue to do that.  Discussed patient's lab results from last week--her lipase, CBC, CMP were all normal. --Continue taking pantoprazole  40 mg daily Chronic midline low back pain with bilateral sciatica Patient mentioned that her midline low back pain also feels much better after she started taking her pregabalin .  Patient was able to sit up on her own without any assistance during her physical exam versus last week she required support from her husband.  Patient to continue taking pregabalin  as prescribed. --Pregabalin (Lyrica ) 25 mg twice daily Healthcare maintenance Pt due for screening mammogram. Will place a referral --Referral for screening mammogram placed    No orders of the defined types were placed in this encounter.    [name], D.O. [facility] Internal Medicine, PGY-1 Date 10/20/2023 Time 8:15 AM

## 2023-10-20 NOTE — Assessment & Plan Note (Signed)
 Pt due for screening mammogram. Will place a referral --Referral for screening mammogram placed

## 2023-10-20 NOTE — Patient Instructions (Signed)
 Please follow the directions as discussed in today's plan: --Continue taking Protonix  as prescribed daily for your abdominal pain --Continue taking pregabalin  as prescribed for your back pain --We have placed an order for mammogram.  You will be getting a call from them to set up an appointment. --We discussed your lab results today.  They were all normal.  Thank you for seeing us  today!  Sincerely, Dr. Edgardo

## 2023-10-20 NOTE — Addendum Note (Signed)
 Addended by: Zohan Shiflet on: 10/20/2023 02:15 PM   Modules accepted: Orders

## 2023-10-20 NOTE — Assessment & Plan Note (Signed)
 Patient mentioned that her midline low back pain also feels much better after she started taking her pregabalin .  Patient was able to sit up on her own without any assistance during her physical exam versus last week she required support from her husband.  Patient to continue taking pregabalin  as prescribed. --Pregabalin (Lyrica ) 25 mg twice daily

## 2023-10-20 NOTE — Progress Notes (Signed)
 Internal Medicine Clinic Attending  I was physically present during the key portions of the resident provided service and participated in the medical decision making of patient's management care. I reviewed pertinent patient test results.  The assessment, diagnosis, and plan were formulated together and I agree with the documentation in the resident's note.  Jone Dauphin MD
